# Patient Record
Sex: Female | Born: 1940 | Race: Black or African American | Hispanic: No | Marital: Single | Smoking: Never smoker
Health system: Southern US, Community
[De-identification: ages and names within clinical notes are randomized; demographics above are authoritative.]

## PROBLEM LIST (undated history)

## (undated) DIAGNOSIS — E872 Acidosis: Secondary | ICD-10-CM

## (undated) DIAGNOSIS — K851 Biliary acute pancreatitis without necrosis or infection: Secondary | ICD-10-CM

## (undated) DIAGNOSIS — K859 Acute pancreatitis without necrosis or infection, unspecified: Secondary | ICD-10-CM

## (undated) DIAGNOSIS — R4182 Altered mental status, unspecified: Secondary | ICD-10-CM

## (undated) DIAGNOSIS — I10 Essential (primary) hypertension: Secondary | ICD-10-CM

## (undated) DIAGNOSIS — N19 Unspecified kidney failure: Secondary | ICD-10-CM

## (undated) DIAGNOSIS — E162 Hypoglycemia, unspecified: Secondary | ICD-10-CM

## (undated) DIAGNOSIS — E785 Hyperlipidemia, unspecified: Secondary | ICD-10-CM

## (undated) HISTORY — DX: Altered mental status, unspecified: R41.82

## (undated) HISTORY — DX: Acute pancreatitis without necrosis or infection, unspecified: K85.90

## (undated) HISTORY — DX: Essential (primary) hypertension: I10

## (undated) HISTORY — DX: Hypoglycemia, unspecified: E16.2

## (undated) HISTORY — DX: Biliary acute pancreatitis without necrosis or infection: K85.10

## (undated) HISTORY — DX: Acidosis: E87.2

## (undated) HISTORY — DX: Hyperlipidemia, unspecified: E78.5

## (undated) HISTORY — DX: Unspecified kidney failure: N19

---

## 2016-02-04 ENCOUNTER — Encounter (HOSPITAL_COMMUNITY): Payer: Self-pay | Admitting: Emergency Medicine

## 2016-02-04 ENCOUNTER — Emergency Department (HOSPITAL_COMMUNITY): Payer: Medicare Other

## 2016-02-04 ENCOUNTER — Inpatient Hospital Stay (HOSPITAL_COMMUNITY)
Admission: EM | Admit: 2016-02-04 | Discharge: 2016-02-12 | DRG: 418 | Disposition: A | Discharge status: Home / Self Care | Payer: Medicare Other | Attending: Internal Medicine | Admitting: Internal Medicine

## 2016-02-04 DIAGNOSIS — N183 Chronic kidney disease, stage 3 (moderate): Secondary | ICD-10-CM | POA: Diagnosis not present

## 2016-02-04 DIAGNOSIS — N19 Unspecified kidney failure: Secondary | ICD-10-CM

## 2016-02-04 DIAGNOSIS — K8050 Calculus of bile duct without cholangitis or cholecystitis without obstruction: Secondary | ICD-10-CM

## 2016-02-04 DIAGNOSIS — Z720 Tobacco use: Secondary | ICD-10-CM

## 2016-02-04 DIAGNOSIS — I10 Essential (primary) hypertension: Secondary | ICD-10-CM | POA: Diagnosis not present

## 2016-02-04 DIAGNOSIS — E878 Other disorders of electrolyte and fluid balance, not elsewhere classified: Secondary | ICD-10-CM | POA: Diagnosis not present

## 2016-02-04 DIAGNOSIS — E872 Acidosis, unspecified: Secondary | ICD-10-CM

## 2016-02-04 DIAGNOSIS — N170 Acute kidney failure with tubular necrosis: Secondary | ICD-10-CM | POA: Diagnosis not present

## 2016-02-04 DIAGNOSIS — Z79899 Other long term (current) drug therapy: Secondary | ICD-10-CM | POA: Diagnosis not present

## 2016-02-04 DIAGNOSIS — R945 Abnormal results of liver function studies: Secondary | ICD-10-CM | POA: Diagnosis not present

## 2016-02-04 DIAGNOSIS — R000 Tachycardia, unspecified: Secondary | ICD-10-CM | POA: Diagnosis not present

## 2016-02-04 DIAGNOSIS — K859 Acute pancreatitis without necrosis or infection, unspecified: Secondary | ICD-10-CM | POA: Diagnosis not present

## 2016-02-04 DIAGNOSIS — K851 Biliary acute pancreatitis without necrosis or infection: Principal | ICD-10-CM | POA: Diagnosis present

## 2016-02-04 DIAGNOSIS — K8010 Calculus of gallbladder with chronic cholecystitis without obstruction: Secondary | ICD-10-CM | POA: Diagnosis not present

## 2016-02-04 DIAGNOSIS — F0390 Unspecified dementia without behavioral disturbance: Secondary | ICD-10-CM | POA: Diagnosis not present

## 2016-02-04 DIAGNOSIS — I129 Hypertensive chronic kidney disease with stage 1 through stage 4 chronic kidney disease, or unspecified chronic kidney disease: Secondary | ICD-10-CM | POA: Diagnosis not present

## 2016-02-04 DIAGNOSIS — N179 Acute kidney failure, unspecified: Secondary | ICD-10-CM | POA: Diagnosis present

## 2016-02-04 DIAGNOSIS — E876 Hypokalemia: Secondary | ICD-10-CM | POA: Diagnosis not present

## 2016-02-04 DIAGNOSIS — K8020 Calculus of gallbladder without cholecystitis without obstruction: Secondary | ICD-10-CM | POA: Diagnosis not present

## 2016-02-04 DIAGNOSIS — Z419 Encounter for procedure for purposes other than remedying health state, unspecified: Secondary | ICD-10-CM

## 2016-02-04 DIAGNOSIS — K8500 Idiopathic acute pancreatitis without necrosis or infection: Secondary | ICD-10-CM | POA: Diagnosis not present

## 2016-02-04 DIAGNOSIS — R739 Hyperglycemia, unspecified: Secondary | ICD-10-CM | POA: Diagnosis present

## 2016-02-04 DIAGNOSIS — K819 Cholecystitis, unspecified: Secondary | ICD-10-CM | POA: Diagnosis not present

## 2016-02-04 DIAGNOSIS — I360 Nonrheumatic tricuspid (valve) stenosis: Secondary | ICD-10-CM | POA: Diagnosis not present

## 2016-02-04 DIAGNOSIS — K8030 Calculus of bile duct with cholangitis, unspecified, without obstruction: Secondary | ICD-10-CM | POA: Diagnosis not present

## 2016-02-04 DIAGNOSIS — R109 Unspecified abdominal pain: Secondary | ICD-10-CM | POA: Diagnosis not present

## 2016-02-04 DIAGNOSIS — K831 Obstruction of bile duct: Secondary | ICD-10-CM | POA: Diagnosis not present

## 2016-02-04 DIAGNOSIS — K838 Other specified diseases of biliary tract: Secondary | ICD-10-CM | POA: Diagnosis not present

## 2016-02-04 HISTORY — DX: Acidosis, unspecified: E87.20

## 2016-02-04 HISTORY — DX: Acidosis: E87.2

## 2016-02-04 HISTORY — DX: Acute pancreatitis without necrosis or infection, unspecified: K85.90

## 2016-02-04 HISTORY — DX: Unspecified kidney failure: N19

## 2016-02-04 LAB — I-STAT CG4 LACTIC ACID, ED
LACTIC ACID, VENOUS: 3.37 mmol/L — AB (ref 0.5–1.9)
LACTIC ACID, VENOUS: 0.79 mmol/L (ref 0.5–1.9)

## 2016-02-04 LAB — LIPASE, BLOOD: Lipase: 5081 U/L — ABNORMAL HIGH (ref 11–51)

## 2016-02-04 LAB — CBC WITH DIFFERENTIAL/PLATELET
BASOS ABS: 0 10*3/uL (ref 0.0–0.1)
Basophils Relative: 0 %
Eosinophils Absolute: 0 10*3/uL (ref 0.0–0.7)
Eosinophils Relative: 0 %
HEMATOCRIT: 44.3 % (ref 36.0–46.0)
HEMOGLOBIN: 15.6 g/dL — AB (ref 12.0–15.0)
LYMPHS PCT: 6 %
Lymphs Abs: 0.8 10*3/uL (ref 0.7–4.0)
MCH: 35.1 pg — ABNORMAL HIGH (ref 26.0–34.0)
MCHC: 35.2 g/dL (ref 30.0–36.0)
MCV: 99.8 fL (ref 78.0–100.0)
MONO ABS: 1.9 10*3/uL — AB (ref 0.1–1.0)
MONOS PCT: 14 %
NEUTROS ABS: 10.8 10*3/uL — AB (ref 1.7–7.7)
Neutrophils Relative %: 80 %
Platelets: 255 10*3/uL (ref 150–400)
RBC: 4.44 MIL/uL (ref 3.87–5.11)
RDW: 14.1 % (ref 11.5–15.5)
WBC: 13.5 10*3/uL — ABNORMAL HIGH (ref 4.0–10.5)

## 2016-02-04 LAB — URINALYSIS, ROUTINE W REFLEX MICROSCOPIC
GLUCOSE, UA: NEGATIVE mg/dL
Ketones, ur: 40 mg/dL — AB
Leukocytes, UA: NEGATIVE
Nitrite: NEGATIVE
PH: 5.5 (ref 5.0–8.0)
Protein, ur: 100 mg/dL — AB
SPECIFIC GRAVITY, URINE: 1.02 (ref 1.005–1.030)

## 2016-02-04 LAB — COMPREHENSIVE METABOLIC PANEL
ALK PHOS: 53 U/L (ref 38–126)
ALT: 8 U/L — ABNORMAL LOW (ref 14–54)
AST: 23 U/L (ref 15–41)
Albumin: 3.8 g/dL (ref 3.5–5.0)
Anion gap: 15 (ref 5–15)
BILIRUBIN TOTAL: 1 mg/dL (ref 0.3–1.2)
BUN: 28 mg/dL — AB (ref 6–20)
CALCIUM: 9.1 mg/dL (ref 8.9–10.3)
CO2: 18 mmol/L — ABNORMAL LOW (ref 22–32)
Chloride: 102 mmol/L (ref 101–111)
Creatinine, Ser: 2.88 mg/dL — ABNORMAL HIGH (ref 0.44–1.00)
GFR calc Af Amer: 17 mL/min — ABNORMAL LOW (ref >60)
GFR, EST NON AFRICAN AMERICAN: 15 mL/min — AB (ref >60)
Glucose, Bld: 177 mg/dL — ABNORMAL HIGH (ref 65–99)
POTASSIUM: 4.3 mmol/L (ref 3.5–5.1)
Sodium: 135 mmol/L (ref 135–145)
TOTAL PROTEIN: 7.8 g/dL (ref 6.5–8.1)

## 2016-02-04 LAB — I-STAT TROPONIN, ED: TROPONIN I, POC: 0.04 ng/mL (ref 0.00–0.08)

## 2016-02-04 LAB — URINE MICROSCOPIC-ADD ON

## 2016-02-04 LAB — D-DIMER, QUANTITATIVE: D-Dimer, Quant: 14.54 ug/mL-FEU — ABNORMAL HIGH (ref 0.00–0.50)

## 2016-02-04 MED ORDER — SODIUM CHLORIDE 0.9 % IV BOLUS (SEPSIS)
1000.0000 mL | Freq: Once | INTRAVENOUS | Status: AC
  Administered 2016-02-04: 1000 mL via INTRAVENOUS

## 2016-02-04 MED ORDER — MORPHINE SULFATE (PF) 4 MG/ML IV SOLN: 4.0000 mg | Freq: Once | INTRAVENOUS | Status: DC

## 2016-02-04 MED ORDER — IOPAMIDOL (ISOVUE-300) INJECTION 61%: 30.0000 mL | Freq: Once | INTRAVENOUS | Status: DC | PRN

## 2016-02-04 MED ORDER — DEXTROSE-NACL 5-0.9 % IV SOLN
INTRAVENOUS | Status: DC
  Administered 2016-02-05: 125 mL/h via INTRAVENOUS
  Administered 2016-02-06: 05:00:00 via INTRAVENOUS

## 2016-02-04 MED ORDER — ONDANSETRON HCL 4 MG/2ML IJ SOLN
4.0000 mg | Freq: Four times a day (QID) | INTRAMUSCULAR | Status: DC | PRN
  Administered 2016-02-07: 4 mg via INTRAVENOUS
  Filled 2016-02-04: qty 2

## 2016-02-04 MED ORDER — ACETAMINOPHEN 650 MG RE SUPP: 650.0000 mg | Freq: Four times a day (QID) | RECTAL | Status: DC | PRN

## 2016-02-04 MED ORDER — ONDANSETRON HCL 4 MG PO TABS: 4.0000 mg | ORAL_TABLET | Freq: Four times a day (QID) | ORAL | Status: DC | PRN

## 2016-02-04 MED ORDER — METOPROLOL TARTRATE 25 MG PO TABS
25.0000 mg | ORAL_TABLET | Freq: Two times a day (BID) | ORAL | Status: DC
  Administered 2016-02-04: 25 mg via ORAL
  Filled 2016-02-04: qty 1

## 2016-02-04 MED ORDER — SODIUM CHLORIDE 0.9 % IV SOLN
INTRAVENOUS | Status: AC
  Administered 2016-02-04: 21:00:00 via INTRAVENOUS

## 2016-02-04 MED ORDER — AMLODIPINE BESYLATE 10 MG PO TABS
10.0000 mg | ORAL_TABLET | Freq: Every day | ORAL | Status: DC
  Administered 2016-02-04 – 2016-02-05 (×2): 10 mg via ORAL
  Filled 2016-02-04: qty 2
  Filled 2016-02-04: qty 1

## 2016-02-04 MED ORDER — ENOXAPARIN SODIUM 30 MG/0.3ML ~~LOC~~ SOLN
30.0000 mg | Freq: Every day | SUBCUTANEOUS | Status: DC
  Administered 2016-02-04 – 2016-02-10 (×7): 30 mg via SUBCUTANEOUS
  Filled 2016-02-04 (×9): qty 0.3

## 2016-02-04 MED ORDER — LABETALOL HCL 5 MG/ML IV SOLN
10.0000 mg | Freq: Once | INTRAVENOUS | Status: AC
  Administered 2016-02-04: 10 mg via INTRAVENOUS
  Filled 2016-02-04: qty 4

## 2016-02-04 MED ORDER — ONDANSETRON HCL 4 MG/2ML IJ SOLN
4.0000 mg | Freq: Once | INTRAMUSCULAR | Status: AC
  Administered 2016-02-04: 4 mg via INTRAVENOUS
  Filled 2016-02-04: qty 2

## 2016-02-04 MED ORDER — DEXTROSE-NACL 5-0.9 % IV SOLN: INTRAVENOUS | Status: DC

## 2016-02-04 MED ORDER — PIPERACILLIN-TAZOBACTAM 3.375 G IVPB 30 MIN
3.3750 g | Freq: Once | INTRAVENOUS | Status: AC
  Administered 2016-02-04: 3.375 g via INTRAVENOUS
  Filled 2016-02-04: qty 50

## 2016-02-04 MED ORDER — ACETAMINOPHEN 325 MG PO TABS
650.0000 mg | ORAL_TABLET | Freq: Four times a day (QID) | ORAL | Status: DC | PRN
  Administered 2016-02-07: 650 mg via ORAL
  Filled 2016-02-04: qty 2

## 2016-02-04 NOTE — ED Notes (Signed)
EDP given lactic result.

## 2016-02-04 NOTE — ED Notes (Addendum)
Manual BP checked-185/118. Tegeler, MD notified.

## 2016-02-04 NOTE — ED Notes (Signed)
Rectal Temp-97.96F

## 2016-02-04 NOTE — ED Notes (Signed)
Manual BP Check- 215/100

## 2016-02-04 NOTE — Consult Note (Signed)
Reason for Consult:Gallstone pancreatitis Referring Physician: Dr Doran Durand is an 75 y.o. female.  HPI: 75 year old female who presents to the hospital with approximately 3-4 weeks of worsening upper abdominal pain. This was associated with new onset nausea and vomiting yesterday.  She denies any history of alcohol abuse.  History reviewed. No pertinent past medical history.  History reviewed. No pertinent surgical history.  History reviewed. No pertinent family history.  Social History:  reports that she has never smoked. She has never used smokeless tobacco. She reports that she does not drink alcohol or use drugs.  Allergies: No Known Allergies  Medications: I have reviewed the patient's current medications.  Results for orders placed or performed during the hospital encounter of 02/04/16 (from the past 48 hour(s))  Urinalysis, Routine w reflex microscopic (not at [facility])     Status: Abnormal   Collection Time: 02/04/16 11:03 AM  Result Value Ref Range   Color, Urine AMBER (A) YELLOW    Comment: BIOCHEMICALS MAY BE AFFECTED BY COLOR   APPearance TURBID (A) CLEAR   Specific Gravity, Urine 1.020 1.005 - 1.030   pH 5.5 5.0 - 8.0   Glucose, UA NEGATIVE NEGATIVE mg/dL   Hgb urine dipstick MODERATE (A) NEGATIVE   Bilirubin Urine LARGE (A) NEGATIVE   Ketones, ur 40 (A) NEGATIVE mg/dL   Protein, ur 100 (A) NEGATIVE mg/dL   Nitrite NEGATIVE NEGATIVE   Leukocytes, UA NEGATIVE NEGATIVE  Urine microscopic-add on     Status: Abnormal   Collection Time: 02/04/16 11:03 AM  Result Value Ref Range   Squamous Epithelial / LPF 0-5 (A) NONE SEEN   WBC, UA 0-5 0 - 5 WBC/hpf   RBC / HPF 6-30 0 - 5 RBC/hpf   Bacteria, UA MANY (A) NONE SEEN   Urine-Other MUCOUS PRESENT   CBC with Differential     Status: Abnormal   Collection Time: 02/04/16 12:05 PM  Result Value Ref Range   WBC 13.5 (H) 4.0 - 10.5 K/uL   RBC 4.44 3.87 - 5.11 MIL/uL   Hemoglobin 15.6 (H) 12.0 - 15.0 g/dL   HCT  44.3 36.0 - 46.0 %   MCV 99.8 78.0 - 100.0 fL   MCH 35.1 (H) 26.0 - 34.0 pg   MCHC 35.2 30.0 - 36.0 g/dL   RDW 14.1 11.5 - 15.5 %   Platelets 255 150 - 400 K/uL   Neutrophils Relative % 80 %   Neutro Abs 10.8 (H) 1.7 - 7.7 K/uL   Lymphocytes Relative 6 %   Lymphs Abs 0.8 0.7 - 4.0 K/uL   Monocytes Relative 14 %   Monocytes Absolute 1.9 (H) 0.1 - 1.0 K/uL   Eosinophils Relative 0 %   Eosinophils Absolute 0.0 0.0 - 0.7 K/uL   Basophils Relative 0 %   Basophils Absolute 0.0 0.0 - 0.1 K/uL  Comprehensive metabolic panel     Status: Abnormal   Collection Time: 02/04/16 12:05 PM  Result Value Ref Range   Sodium 135 135 - 145 mmol/L   Potassium 4.3 3.5 - 5.1 mmol/L   Chloride 102 101 - 111 mmol/L   CO2 18 (L) 22 - 32 mmol/L   Glucose, Bld 177 (H) 65 - 99 mg/dL   BUN 28 (H) 6 - 20 mg/dL   Creatinine, Ser 2.88 (H) 0.44 - 1.00 mg/dL   Calcium 9.1 8.9 - 10.3 mg/dL   Total Protein 7.8 6.5 - 8.1 g/dL   Albumin 3.8 3.5 - 5.0 g/dL  AST 23 15 - 41 U/L   ALT 8 (L) 14 - 54 U/L   Alkaline Phosphatase 53 38 - 126 U/L   Total Bilirubin 1.0 0.3 - 1.2 mg/dL   GFR calc non Af Amer 15 (L) >60 mL/min   GFR calc Af Amer 17 (L) >60 mL/min    Comment: (NOTE) The eGFR has been calculated using the CKD EPI equation. This calculation has not been validated in all clinical situations. eGFR's persistently <60 mL/min signify possible Chronic Kidney Disease.    Anion gap 15 5 - 15  Lipase, blood     Status: Abnormal   Collection Time: 02/04/16 12:05 PM  Result Value Ref Range   Lipase 5,081 (H) 11 - 51 U/L    Comment: RESULTS CONFIRMED BY MANUAL DILUTION  I-Stat CG4 Lactic Acid, ED     Status: Abnormal   Collection Time: 02/04/16 12:15 PM  Result Value Ref Range   Lactic Acid, Venous 3.37 (HH) 0.5 - 1.9 mmol/L   Comment NOTIFIED PHYSICIAN   I-Stat Troponin, ED (not at [facility])     Status: None   Collection Time: 02/04/16 12:19 PM  Result Value Ref Range   Troponin i, poc 0.04 0.00 - 0.08 ng/mL    Comment 3            Comment: Due to the release kinetics of cTnI, a negative result within the first hours of the onset of symptoms does not rule out myocardial infarction with certainty. If myocardial infarction is still suspected, repeat the test at appropriate intervals.   D-dimer, quantitative (not at [facility])     Status: Abnormal   Collection Time: 02/04/16 12:50 PM  Result Value Ref Range   D-Dimer, Quant 14.54 (H) 0.00 - 0.50 ug/mL-FEU    Comment: (NOTE) At the manufacturer cut-off of 0.50 ug/mL FEU, this assay has been documented to exclude PE with a sensitivity and negative predictive value of 97 to 99%.  At this time, this assay has not been approved by the FDA to exclude DVT/VTE. Results should be correlated with clinical presentation.   I-Stat CG4 Lactic Acid, ED     Status: None   Collection Time: 02/04/16  3:14 PM  Result Value Ref Range   Lactic Acid, Venous 0.79 0.5 - 1.9 mmol/L    Ct Abdomen Pelvis Wo Contrast  Result Date: 02/04/2016 CLINICAL DATA:  75 year old female with recent history of abdominal pain, with some vomiting and diarrhea yesterday. Lethargy and disorientation. EXAM: CT ABDOMEN AND PELVIS WITHOUT CONTRAST TECHNIQUE: Multidetector CT imaging of the abdomen and pelvis was performed following the standard protocol without IV contrast. COMPARISON:  No priors. FINDINGS: Lower chest: Atherosclerotic calcifications in the left circumflex and right coronary arteries. Calcifications of the mitral annulus and the aortic root. Mild scarring throughout the visualize lung bases. Hepatobiliary: No definite cystic or solid hepatic lesions identified on today's noncontrast CT examination. Tiny calcified granuloma in the right lobe of the liver. Numerous calcified gallstones lying dependently in the gallbladder. No findings to suggest an acute cholecystitis at this time. Pancreas: No discrete pancreatic mass confidently identified on today's noncontrast CT examination.  However, there are extensive inflammatory changes noted throughout the retroperitoneum adjacent to the spleen, concerning for an acute pancreatitis. No well-defined peripancreatic fluid collection is noted to strongly suggest a pancreatic pseudocyst at this time. Spleen: Unremarkable. Adrenals/Urinary Tract: Unenhanced appearance of the kidneys and bilateral adrenal glands is unremarkable. No hydroureteronephrosis. Urinary bladder is normal in appearance. Stomach/Bowel: Unenhanced appearance  of the stomach is normal. There is no pathologic dilatation of small bowel or colon. Normal appendix. Rectum is moderately distended with well-formed stool, exerting mass effect upon adjacent structures. Vascular/Lymphatic: Aortic atherosclerosis, without definite aneurysm in the abdominal or pelvic vasculature. No definite lymphadenopathy is noted in the abdomen or pelvis. Reproductive: Uterus and ovaries are atrophic. Importantly, the vagina appears partially distended by what appears to be feculent contents, raising concern for potential rectovaginal fistula. Other: No significant volume of ascites.  No pneumoperitoneum. Musculoskeletal: There are no aggressive appearing lytic or blastic lesions noted in the visualized portions of the skeleton. IMPRESSION: 1. Imaging findings are concerning for an acute pancreatitis. 2. Cholelithiasis without evidence to suggest an acute cholecystitis at this time. 3. The vagina appears partially distended with what appears to be feculent contents. This raises concern for potential rectovaginal fistula. Clinical correlation is suggested. 4. Aortic atherosclerosis, in addition to at least 2 vessel coronary artery disease. Assessment for potential risk factor modification, dietary therapy or pharmacologic therapy may be warranted, if clinically indicated. 5. Additional incidental findings, as above. Electronically Signed   By: Vinnie Langton M.D.   On: 02/04/2016 15:00   Dg Chest 2  View  Result Date: 02/04/2016 CLINICAL DATA:  tachycardia, disoriented EXAM: CHEST  2 VIEW COMPARISON:  None. FINDINGS: Cardiomediastinal silhouette is unremarkable. No acute infiltrate or pleural effusion. No pulmonary edema. Mild degenerative changes thoracic spine. IMPRESSION: No active cardiopulmonary disease. Electronically Signed   By: Lahoma Crocker M.D.   On: 02/04/2016 12:32    Review of Systems  Constitutional: Negative for chills and fever.  Eyes: Negative for blurred vision.  Respiratory: Negative for cough and shortness of breath.   Cardiovascular: Negative for chest pain.  Gastrointestinal: Positive for abdominal pain, nausea and vomiting.  Genitourinary: Negative for dysuria, frequency and urgency.  Skin: Negative for rash.  Neurological: Negative for dizziness and headaches.   Blood pressure (!) 212/97, pulse 95, temperature 99.8 F (37.7 C), temperature source Oral, resp. rate 18, height 5' (1.524 m), weight 44.4 kg (97 lb 14.2 oz), SpO2 100 %. Physical Exam  Constitutional: She is oriented to person, place, and time. She appears well-developed and well-nourished.  HENT:  Head: Normocephalic and atraumatic.  Eyes: Conjunctivae and EOM are normal. Pupils are equal, round, and reactive to light.  Neck: Normal range of motion. Neck supple.  Cardiovascular: Normal rate.   Respiratory: Effort normal and breath sounds normal. No respiratory distress.  GI: Soft. She exhibits no distension. There is tenderness (midepigastric).  Musculoskeletal: Normal range of motion.  Neurological: She is alert and oriented to person, place, and time.  Skin: Skin is warm and dry.    Assessment/Plan: 75 year old female being admitted to the hospital for  What appears to be gallstone pancreatitis.  Will follow with you. Patient may be a candidate for surgery while in the hospital to remove her gallbladder.  Keiden Deskin C. [id number], 9:13 PM

## 2016-02-04 NOTE — ED Provider Notes (Signed)
[facility] DEPT Provider Note   CSN: [id number] Arrival date & time: 02/04/16  1004     History   Chief Complaint Chief Complaint  Patient presents with  . decreased appetite    HPI Kristine Shaffer is a 75 y.o. female with no past medical history who has not seen a doctor in "a long time" who presents with altered mental status, nausea, vomiting, abdominal pain, and diarrhea. Patient is committed by her niece whom she lives with. She moved in with her niece 4 months ago and these reports the patient "never eats". She denies any change in by mouth intake however she does not think the patient has eaten anything in several days. She says that the patient has planed of abdominal pain over the last few days that worsened yesterday. She also had episode of nausea and vomiting. Patient had diarrhea yesterday and today. Patient describes the pain as a 8 out of 10 in severity, located in her epigastrium radiating all over her abdomen. She denies any fevers or chills, chest pain, shortness of breath, palpitations, lightheadedness, or any urinary symptoms. Patient is disoriented and only is oriented to her name. And he says that the patient is a normal baseline mental status and has no history of dementia. Niece denies any falls recently. Patient denies any recent trauma.    The history is provided by the patient and a relative. No language interpreter was used.  Abdominal Pain   This is a new problem. The current episode started yesterday. The problem occurs constantly. The problem has been gradually worsening. The pain is located in the generalized abdominal region and epigastric region. The quality of the pain is aching, sharp and pressure-like. The pain is at a severity of 8/10. The pain is severe. Associated symptoms include anorexia, diarrhea, nausea and vomiting. Pertinent negatives include fever, constipation, dysuria, frequency, hematuria and headaches. The symptoms are aggravated by  palpation. Nothing relieves the symptoms.    History reviewed. No pertinent past medical history.  There are no active problems to display for this patient.   History reviewed. No pertinent surgical history.  OB History    No data available       Home Medications    Prior to Admission medications   Not on File    Family History History reviewed. No pertinent family history.  Social History Social History  Substance Use Topics  . Smoking status: Never Smoker  . Smokeless tobacco: Never Used  . Alcohol use No     Allergies   Review of patient's allergies indicates no known allergies.   Review of Systems Review of Systems  Constitutional: Positive for chills. Negative for activity change, diaphoresis, fatigue and fever.  HENT: Negative for congestion and rhinorrhea.   Eyes: Negative for visual disturbance.  Respiratory: Negative for cough, chest tightness, shortness of breath and stridor.   Cardiovascular: Negative for chest pain, palpitations and leg swelling.  Gastrointestinal: Positive for abdominal pain, anorexia, diarrhea, nausea and vomiting. Negative for abdominal distention and constipation.  Genitourinary: Positive for decreased urine volume. Negative for difficulty urinating, dysuria, flank pain, frequency, hematuria, menstrual problem, pelvic pain, vaginal bleeding and vaginal discharge.  Musculoskeletal: Negative for back pain and neck pain.  Skin: Negative for rash and wound.  Neurological: Negative for dizziness, weakness, light-headedness, numbness and headaches.  Psychiatric/Behavioral: Negative for agitation and confusion.  All other systems reviewed and are negative.    Physical Exam Updated Vital Signs BP (!) 155/107   Pulse 119  Resp 16   SpO2 100%   Physical Exam  Constitutional: She is oriented to person, place, and time. She appears well-developed and well-nourished. No distress.  HENT:  Head: Normocephalic and atraumatic.    Mouth/Throat: Oropharynx is clear and moist.  Eyes: Conjunctivae and EOM are normal. Pupils are equal, round, and reactive to light.  Neck: Normal range of motion. Neck supple.  Cardiovascular: Regular rhythm.   No murmur heard. Pulmonary/Chest: Effort normal and breath sounds normal. No respiratory distress. She has no wheezes. She exhibits no tenderness.  Abdominal: Soft. There is tenderness in the right upper quadrant, epigastric area and left upper quadrant. There is no CVA tenderness.    Musculoskeletal: She exhibits no edema.  Neurological: She is alert and oriented to person, place, and time. She has normal reflexes. She displays normal reflexes. No cranial nerve deficit. She exhibits normal muscle tone.  Skin: Skin is warm and dry. Capillary refill takes less than 2 seconds. No rash noted. No erythema.  Psychiatric: She has a normal mood and affect.  Nursing note and vitals reviewed.    ED Treatments / Results  Labs (all labs ordered are listed, but only abnormal results are displayed) Labs Reviewed  CBC WITH DIFFERENTIAL/PLATELET - Abnormal; Notable for the following:       Result Value   WBC 13.5 (*)    Hemoglobin 15.6 (*)    MCH 35.1 (*)    Neutro Abs 10.8 (*)    Monocytes Absolute 1.9 (*)    All other components within normal limits  COMPREHENSIVE METABOLIC PANEL - Abnormal; Notable for the following:    CO2 18 (*)    Glucose, Bld 177 (*)    BUN 28 (*)    Creatinine, Ser 2.88 (*)    ALT 8 (*)    GFR calc non Af Amer 15 (*)    GFR calc Af Amer 17 (*)    All other components within normal limits  LIPASE, BLOOD - Abnormal; Notable for the following:    Lipase 5,081 (*)    All other components within normal limits  URINALYSIS, ROUTINE W REFLEX MICROSCOPIC (NOT AT [facility]) - Abnormal; Notable for the following:    Color, Urine AMBER (*)    APPearance TURBID (*)    Hgb urine dipstick MODERATE (*)    Bilirubin Urine LARGE (*)    Ketones, ur 40 (*)    Protein, ur 100  (*)    All other components within normal limits  D-DIMER, QUANTITATIVE (NOT AT [facility]) - Abnormal; Notable for the following:    D-Dimer, Quant 14.54 (*)    All other components within normal limits  URINE MICROSCOPIC-ADD ON - Abnormal; Notable for the following:    Squamous Epithelial / LPF 0-5 (*)    Bacteria, UA MANY (*)    All other components within normal limits  I-STAT CG4 LACTIC ACID, ED - Abnormal; Notable for the following:    Lactic Acid, Venous 3.37 (*)    All other components within normal limits  URINE CULTURE  CULTURE, BLOOD (ROUTINE X 2)  CULTURE, BLOOD (ROUTINE X 2)  BASIC METABOLIC PANEL  RPR  RAPID HIV SCREEN (HIV 1/2 AB+AG)  HIV ANTIBODY (ROUTINE TESTING)  TRIGLYCERIDES  VITAMIN B12  LIPASE, BLOOD  I-STAT TROPOININ, ED  I-STAT CG4 LACTIC ACID, ED    EKG  EKG Interpretation  Date/Time:  Thursday February 04 2016 10:29:36 EDT Ventricular Rate:  121 PR Interval:    QRS Duration: 74 QT Interval:  291 QTC Calculation: 413 R Axis:   69 Text Interpretation:  Sinus tachycardia Left atrial enlargement Probable LVH with secondary repol abnrm ST depression, consider ischemia, diffuse lds No Prior ECG available.  Abnormal ECG Confirmed by Sherry Ruffing MD, Jaslen Adcox [phone]) on 02/04/2016 11:24:43 AM       Radiology Ct Abdomen Pelvis Wo Contrast  Result Date: 02/04/2016 CLINICAL DATA:  75 year old female with recent history of abdominal pain, with some vomiting and diarrhea yesterday. Lethargy and disorientation. EXAM: CT ABDOMEN AND PELVIS WITHOUT CONTRAST TECHNIQUE: Multidetector CT imaging of the abdomen and pelvis was performed following the standard protocol without IV contrast. COMPARISON:  No priors. FINDINGS: Lower chest: Atherosclerotic calcifications in the left circumflex and right coronary arteries. Calcifications of the mitral annulus and the aortic root. Mild scarring throughout the visualize lung bases. Hepatobiliary: No definite cystic or solid hepatic  lesions identified on today's noncontrast CT examination. Tiny calcified granuloma in the right lobe of the liver. Numerous calcified gallstones lying dependently in the gallbladder. No findings to suggest an acute cholecystitis at this time. Pancreas: No discrete pancreatic mass confidently identified on today's noncontrast CT examination. However, there are extensive inflammatory changes noted throughout the retroperitoneum adjacent to the spleen, concerning for an acute pancreatitis. No well-defined peripancreatic fluid collection is noted to strongly suggest a pancreatic pseudocyst at this time. Spleen: Unremarkable. Adrenals/Urinary Tract: Unenhanced appearance of the kidneys and bilateral adrenal glands is unremarkable. No hydroureteronephrosis. Urinary bladder is normal in appearance. Stomach/Bowel: Unenhanced appearance of the stomach is normal. There is no pathologic dilatation of small bowel or colon. Normal appendix. Rectum is moderately distended with well-formed stool, exerting mass effect upon adjacent structures. Vascular/Lymphatic: Aortic atherosclerosis, without definite aneurysm in the abdominal or pelvic vasculature. No definite lymphadenopathy is noted in the abdomen or pelvis. Reproductive: Uterus and ovaries are atrophic. Importantly, the vagina appears partially distended by what appears to be feculent contents, raising concern for potential rectovaginal fistula. Other: No significant volume of ascites.  No pneumoperitoneum. Musculoskeletal: There are no aggressive appearing lytic or blastic lesions noted in the visualized portions of the skeleton. IMPRESSION: 1. Imaging findings are concerning for an acute pancreatitis. 2. Cholelithiasis without evidence to suggest an acute cholecystitis at this time. 3. The vagina appears partially distended with what appears to be feculent contents. This raises concern for potential rectovaginal fistula. Clinical correlation is suggested. 4. Aortic  atherosclerosis, in addition to at least 2 vessel coronary artery disease. Assessment for potential risk factor modification, dietary therapy or pharmacologic therapy may be warranted, if clinically indicated. 5. Additional incidental findings, as above. Electronically Signed   By: Vinnie Langton M.D.   On: 02/04/2016 15:00   Dg Chest 2 View  Result Date: 02/04/2016 CLINICAL DATA:  tachycardia, disoriented EXAM: CHEST  2 VIEW COMPARISON:  None. FINDINGS: Cardiomediastinal silhouette is unremarkable. No acute infiltrate or pleural effusion. No pulmonary edema. Mild degenerative changes thoracic spine. IMPRESSION: No active cardiopulmonary disease. Electronically Signed   By: Lahoma Crocker M.D.   On: 02/04/2016 12:32    Procedures Procedures (including critical care time)  Medications Ordered in ED Medications  morphine 4 MG/ML injection 4 mg (0 mg Intravenous Hold 02/04/16 1144)  iopamidol (ISOVUE-300) 61 % injection 30 mL (not administered)  0.9 %  sodium chloride infusion ( Intravenous New Bag/Given 02/04/16 2035)  acetaminophen (TYLENOL) tablet 650 mg (not administered)    Or  acetaminophen (TYLENOL) suppository 650 mg (not administered)  ondansetron (ZOFRAN) tablet 4 mg (not administered)  Or  ondansetron (ZOFRAN) injection 4 mg (not administered)  metoprolol tartrate (LOPRESSOR) tablet 25 mg (25 mg Oral Given 02/04/16 1836)  amLODipine (NORVASC) tablet 10 mg (10 mg Oral Given 02/04/16 1836)  dextrose 5 %-0.9 % sodium chloride infusion (not administered)  enoxaparin (LOVENOX) injection 30 mg (not administered)  sodium chloride 0.9 % bolus 1,000 mL (0 mLs Intravenous Stopped 02/04/16 1219)  ondansetron (ZOFRAN) injection 4 mg (4 mg Intravenous Given 02/04/16 1126)  sodium chloride 0.9 % bolus 1,000 mL (0 mLs Intravenous Stopped 02/04/16 1600)  labetalol (NORMODYNE,TRANDATE) injection 10 mg (10 mg Intravenous Given 02/04/16 1319)  piperacillin-tazobactam (ZOSYN) IVPB 3.375 g (0 g  Intravenous Stopped 02/04/16 1450)     Initial Impression / Assessment and Plan / ED Course  I have reviewed the triage vital signs and the nursing notes.  Pertinent labs & imaging results that were available during my care of the patient were reviewed by me and considered in my medical decision making (see chart for details).  Clinical Course    Cyra Pincock is a 75 y.o. female with no past medical history who has not seen a doctor in "a long time" who presents with altered mental status, nausea, vomiting, abdominal pain, and diarrhea.  History and exam are seen above. Next  On exam, patient had tenderness all over her abdomen. Patient's lungs were clear, CVA area was nontender, and patient had normal strength, coordination, and sensation. Patient was tachycardic on arrival in the 1 teens. Initial temperature was not readable in triage so rectal temperature was ordered.  Given concern for dehydration, fluids were ordered, patient given pain medicine, nausea medicine, and workup will be started to look for etiology of her tachycardia as well as imaging to look for occult infection or intra-abdominal pathology.  Patient was tachycardic and hypertensive. Suspect chronic hypertension however, patient given fluids for clinical dehydration. Patient given beta blocker which improve heart rate. Did not continue this as concern for infection worsened.  Laboratory testing Revealed evidence of pancreatitis. Lipase over 5000. Patient also had leukocytosis and elevated lactic acid. With these findings, patient given IV Zosyn for antibiotic coverage. D dimer elevated at 14 however, patient had elevated creatinine and could not tolerate the study. CT of the abdomen change to noncontrast scan.    CT of the abdomen and pelvis return showing evidence of Acute pancreatitis. There was also evidence of possible recto vaginally fistula due to vaginally contents. This will need to be followed up by admitting team.  This was discussed with admitting team.  After workup completed in ED, patient admitted to hospitalist service for further management of kidney injury, pancreatitis, and dehydration. Patient admitted in stable condition.   Final Clinical Impressions(s) / ED Diagnoses   Final diagnoses:  Acute pancreatitis, unspecified complication status, unspecified pancreatitis type   Clinical Impression: 1. Acute pancreatitis, unspecified complication status, unspecified pancreatitis type     Disposition: Admit to Hospitalist service    Courtney Paris, MD 02/04/16 2051

## 2016-02-04 NOTE — Progress Notes (Signed)
Brief Pharmacy Note: Lovenox for VTE prophylaxis  Patient stated weight: 100lb - 45kg SCr 2.88 on admission: Cl ~ 19 ml/min (normalized)  Lovenox 30mg  SQ q24hr  Pharmacy will sign off protocol, will adjust dose if warranted  Thank you,  Minda Ditto PharmD Pager [phone] 02/04/2016, 5:41 PM

## 2016-02-04 NOTE — ED Notes (Signed)
Off floor for testing 

## 2016-02-04 NOTE — ED Notes (Signed)
Pt aware UA needed, attempted bed pan. Unable to collect at this time. Will recheck.

## 2016-02-04 NOTE — ED Notes (Signed)
Below order not completed by EW. 

## 2016-02-04 NOTE — ED Notes (Signed)
I attempted to collect labs and was unsuccessful.  I made the MD aware

## 2016-02-04 NOTE — H&P (Addendum)
History and Physical    Kristine Shaffer [id number] DOB: July 18, 1940 DOA: 02/04/2016    PCP: No primary care provider on file.  Patient coming from: home  Chief Complaint: brought in for abdominal pain and vomiting  HPI: Kristine Shaffer is a 75 y.o. female with who has not seen a doctor in a long time and has no past medical history. The patient is a poor historian (dementia?) but nieces state she has been having abdominal pain for weeks now. She had some severe pain about 1 month ago but it resolved without treatment. Yesterday they noted that the patient had 2 episodes of vomiting. No blood in vomitus. They note that after certain types of food like salad, she has to go the bathroom. The patient denies all symptoms other than abdominal pain. She points to her mid abdomen to show where the pain is. Pain does not radiate and has not been severe.  When asked specifically about possible stool coming out of Vagina, she states that this has not been an issue.   ED Course: Lipase-5081, BUN 28, creatinine 2.88, CO2 18, WBC count 13, hemoglobin 15.6, lactic acid 3.37>> 0.79, d-dimer 14.54, UA shows many bacteria, large amount of bilirubin, moderate hemoglobin, 40 ketones, 0-5 wbc's, 100 protein CT abdomen and pelvis-acute pancreatitis, cholelithiasis, vagina appears distended with possible fecal contents  Review of Systems:  - slow weight loss over the years- about 5 lbs in past month All other systems reviewed and apart from HPI, are negative.  History reviewed. No pertinent past medical history.  History reviewed. No pertinent surgical history.  Social History:   reports that she has never smoked. She has never used smokeless tobacco. She reports that she does not drink alcohol or use drugs.  Dips snuff, drinks a small amount of "hard liquor" per day  No Known Allergies  Family history- HTN   Prior to Admission medications   Medication Sig Start Date End Date Taking? Authorizing Provider    cholecalciferol (VITAMIN D) 1000 units tablet Take 1,000 Units by mouth daily.   Yes Historical Provider, MD  ferrous sulfate 325 (65 FE) MG tablet Take 325 mg by mouth daily with breakfast.   Yes Historical Provider, MD  Multiple Vitamin (MULTIVITAMIN WITH MINERALS) TABS tablet Take 1 tablet by mouth daily.   Yes Historical Provider, MD    Physical Exam: Vitals:   02/04/16 1200 02/04/16 1300 02/04/16 1500 02/04/16 1600  BP: (!) 215/100 (!) 221/105 (!) 206/99 (!) 209/117  Pulse:  110 82 97  Resp: 23 16 21 26   SpO2:  99% 100% 99%      Constitutional: NAD, calm, comfortable Eyes: PERTLA, lids and conjunctivae normal ENMT: Mucous membranes are moist. Posterior pharynx clear of any exudate or lesions. Normal dentition.  Neck: normal, supple, no masses, no thyromegaly Respiratory: clear to auscultation bilaterally, no wheezing, no crackles. Normal respiratory effort. No accessory muscle use.  Cardiovascular: S1 & S2 heard, regular rate and rhythm, no murmurs / rubs / gallops. No extremity edema. 2+ pedal pulses. No carotid bruits.  Abdomen: No distension,  tenderness diffusely in abdomen with maximal tenderness in mid abdomen, no masses palpated. No hepatosplenomegaly. Bowel sounds normal.  Musculoskeletal: no clubbing / cyanosis. No joint deformity upper and lower extremities. Good ROM, no contractures. Normal muscle tone.  Skin: no rashes, lesions, ulcers. No induration Neurologic: CN 2-12 grossly intact. Sensation intact, DTR normal. Strength 5/5 in all 4 limbs.  Psychiatric: Normal judgment and insight. Alert and oriented x 3.  Normal mood.     Labs on Admission: I have personally reviewed following labs and imaging studies  CBC:  Recent Labs Lab 02/04/16 1205  WBC 13.5*  NEUTROABS 10.8*  HGB 15.6*  HCT 44.3  MCV 99.8  PLT 123456   Basic Metabolic Panel:  Recent Labs Lab 02/04/16 1205  NA 135  K 4.3  CL 102  CO2 18*  GLUCOSE 177*  BUN 28*  CREATININE 2.88*   CALCIUM 9.1   GFR: CrCl cannot be calculated (Unknown ideal weight.). Liver Function Tests:  Recent Labs Lab 02/04/16 1205  AST 23  ALT 8*  ALKPHOS 53  BILITOT 1.0  PROT 7.8  ALBUMIN 3.8    Recent Labs Lab 02/04/16 1205  LIPASE 5,081*   No results for input(s): AMMONIA in the last 168 hours. Coagulation Profile: No results for input(s): INR, PROTIME in the last 168 hours. Cardiac Enzymes: No results for input(s): CKTOTAL, CKMB, CKMBINDEX, TROPONINI in the last 168 hours. BNP (last 3 results) No results for input(s): PROBNP in the last 8760 hours. HbA1C: No results for input(s): HGBA1C in the last 72 hours. CBG: No results for input(s): GLUCAP in the last 168 hours. Lipid Profile: No results for input(s): CHOL, HDL, LDLCALC, TRIG, CHOLHDL, LDLDIRECT in the last 72 hours. Thyroid Function Tests: No results for input(s): TSH, T4TOTAL, FREET4, T3FREE, THYROIDAB in the last 72 hours. Anemia Panel: No results for input(s): VITAMINB12, FOLATE, FERRITIN, TIBC, IRON, RETICCTPCT in the last 72 hours. Urine analysis:    Component Value Date/Time   COLORURINE AMBER (A) 02/04/2016 1103   APPEARANCEUR TURBID (A) 02/04/2016 1103   LABSPEC 1.020 02/04/2016 1103   PHURINE 5.5 02/04/2016 1103   GLUCOSEU NEGATIVE 02/04/2016 1103   HGBUR MODERATE (A) 02/04/2016 1103   BILIRUBINUR LARGE (A) 02/04/2016 1103   KETONESUR 40 (A) 02/04/2016 1103   PROTEINUR 100 (A) 02/04/2016 1103   NITRITE NEGATIVE 02/04/2016 1103   LEUKOCYTESUR NEGATIVE 02/04/2016 1103   Sepsis Labs: @LABRCNTIP (procalcitonin:4,lacticidven:4) )No results found for this or any previous visit (from the past 240 hour(s)).   Radiological Exams on Admission: Ct Abdomen Pelvis Wo Contrast  Result Date: 02/04/2016 CLINICAL DATA:  75 year old female with recent history of abdominal pain, with some vomiting and diarrhea yesterday. Lethargy and disorientation. EXAM: CT ABDOMEN AND PELVIS WITHOUT CONTRAST TECHNIQUE:  Multidetector CT imaging of the abdomen and pelvis was performed following the standard protocol without IV contrast. COMPARISON:  No priors. FINDINGS: Lower chest: Atherosclerotic calcifications in the left circumflex and right coronary arteries. Calcifications of the mitral annulus and the aortic root. Mild scarring throughout the visualize lung bases. Hepatobiliary: No definite cystic or solid hepatic lesions identified on today's noncontrast CT examination. Tiny calcified granuloma in the right lobe of the liver. Numerous calcified gallstones lying dependently in the gallbladder. No findings to suggest an acute cholecystitis at this time. Pancreas: No discrete pancreatic mass confidently identified on today's noncontrast CT examination. However, there are extensive inflammatory changes noted throughout the retroperitoneum adjacent to the spleen, concerning for an acute pancreatitis. No well-defined peripancreatic fluid collection is noted to strongly suggest a pancreatic pseudocyst at this time. Spleen: Unremarkable. Adrenals/Urinary Tract: Unenhanced appearance of the kidneys and bilateral adrenal glands is unremarkable. No hydroureteronephrosis. Urinary bladder is normal in appearance. Stomach/Bowel: Unenhanced appearance of the stomach is normal. There is no pathologic dilatation of small bowel or colon. Normal appendix. Rectum is moderately distended with well-formed stool, exerting mass effect upon adjacent structures. Vascular/Lymphatic: Aortic atherosclerosis, without definite aneurysm in  the abdominal or pelvic vasculature. No definite lymphadenopathy is noted in the abdomen or pelvis. Reproductive: Uterus and ovaries are atrophic. Importantly, the vagina appears partially distended by what appears to be feculent contents, raising concern for potential rectovaginal fistula. Other: No significant volume of ascites.  No pneumoperitoneum. Musculoskeletal: There are no aggressive appearing lytic or blastic  lesions noted in the visualized portions of the skeleton. IMPRESSION: 1. Imaging findings are concerning for an acute pancreatitis. 2. Cholelithiasis without evidence to suggest an acute cholecystitis at this time. 3. The vagina appears partially distended with what appears to be feculent contents. This raises concern for potential rectovaginal fistula. Clinical correlation is suggested. 4. Aortic atherosclerosis, in addition to at least 2 vessel coronary artery disease. Assessment for potential risk factor modification, dietary therapy or pharmacologic therapy may be warranted, if clinically indicated. 5. Additional incidental findings, as above. Electronically Signed   By: Vinnie Langton M.D.   On: 02/04/2016 15:00   Dg Chest 2 View  Result Date: 02/04/2016 CLINICAL DATA:  tachycardia, disoriented EXAM: CHEST  2 VIEW COMPARISON:  None. FINDINGS: Cardiomediastinal silhouette is unremarkable. No acute infiltrate or pleural effusion. No pulmonary edema. Mild degenerative changes thoracic spine. IMPRESSION: No active cardiopulmonary disease. Electronically Signed   By: Lahoma Crocker M.D.   On: 02/04/2016 12:32    EKG: Independently reviewed. Sinus tachy, RAD, possible LVH- mild diffuse ST depressions  Assessment/Plan Principal Problem:   Acute pancreatitis  - ? Gallstone related- check Triglycerides - family states she drinks only a small amount each day - NPO, IVF, antiemetics, dose not appear to be in much pain  Active Problems:    Renal failure - possibly chronic in relation to HTN- follow  HTN - likely chronic- no associated symptoms such as headache, other neurological symptoms or chest pain  - start Lopressor and Norvasc  Mild acidosis - lactic acidosis vs AKI related- follow  Leukocytosis - likely stress demargination    Lactic acidosis - has improved  Tachycardia - likely due to acute pancreatitis  Elevated D dimer - d dimer 14- suspect due to acute pancreatitis- will hold  off on further work up  [employer] term memory loss - dementia? - check B12, RPR  Stool in vagina on CT? - will need CT with contrast if she has constant stool leakage      DVT prophylaxis: Lovenox Code Status: Full code Family Communication: neice Disposition Plan: admit to tele  Consults called: gen surgery  Admission status: admit    [facility] MD [facility] Pager: [email or web address] Password [email or web address] 7PM-7AM, please contact night-coverage   02/04/2016, 5:31 PM

## 2016-02-04 NOTE — Progress Notes (Signed)
75 yr old medicare pt without a pcp listed  Pt noted to be sitting up in bed without HOB raised Pt unable to provide most answers to Cm  Pt with 2 Female visitor and a female visitor at bedside Female visitor confirms pt without pcp C/o sleeping more than usual and not eating as much for some time, abdominal pain with elevated labs and bp in [facility] ED  Pt and family given a list of medicare providers within zip code [phone] and a list of doctors that visit at home

## 2016-02-04 NOTE — ED Triage Notes (Addendum)
Family reports pt has been sleeping more than usual and not eating as much for some time. Yesterday pt complained of abd pain. V/d yesterday. Pt disoriented to time. Family reports pt is normally a little more alert.

## 2016-02-04 NOTE — ED Notes (Addendum)
Tegeler, MD notified of Vitals.

## 2016-02-04 NOTE — ED Notes (Signed)
Pt. Unable to urinate at this time. Will collect urine when pt. Voids. Nurse aware.  

## 2016-02-05 LAB — BASIC METABOLIC PANEL
Anion gap: 14 (ref 5–15)
BUN: 29 mg/dL — AB (ref 6–20)
CALCIUM: 9.2 mg/dL (ref 8.9–10.3)
CHLORIDE: 108 mmol/L (ref 101–111)
CO2: 18 mmol/L — ABNORMAL LOW (ref 22–32)
CREATININE: 2.7 mg/dL — AB (ref 0.44–1.00)
GFR, EST AFRICAN AMERICAN: 19 mL/min — AB (ref >60)
GFR, EST NON AFRICAN AMERICAN: 16 mL/min — AB (ref >60)
Glucose, Bld: 130 mg/dL — ABNORMAL HIGH (ref 65–99)
Potassium: 3.5 mmol/L (ref 3.5–5.1)
SODIUM: 140 mmol/L (ref 135–145)

## 2016-02-05 LAB — CBC
HCT: 36 % (ref 36.0–46.0)
Hemoglobin: 12.5 g/dL (ref 12.0–15.0)
MCH: 34.6 pg — ABNORMAL HIGH (ref 26.0–34.0)
MCHC: 34.7 g/dL (ref 30.0–36.0)
MCV: 99.7 fL (ref 78.0–100.0)
PLATELETS: 208 10*3/uL (ref 150–400)
RBC: 3.61 MIL/uL — ABNORMAL LOW (ref 3.87–5.11)
RDW: 13.9 % (ref 11.5–15.5)
WBC: 9.2 10*3/uL (ref 4.0–10.5)

## 2016-02-05 LAB — LACTIC ACID, PLASMA
Lactic Acid, Venous: 3.2 mmol/L (ref 0.5–1.9)
Lactic Acid, Venous: 2.3 mmol/L (ref 0.5–1.9)
Lactic Acid, Venous: 1.3 mmol/L (ref 0.5–1.9)

## 2016-02-05 LAB — COMPREHENSIVE METABOLIC PANEL
ALBUMIN: 3.2 g/dL — AB (ref 3.5–5.0)
ALT: 9 U/L — ABNORMAL LOW (ref 14–54)
ANION GAP: 9 (ref 5–15)
AST: 32 U/L (ref 15–41)
Alkaline Phosphatase: 46 U/L (ref 38–126)
BILIRUBIN TOTAL: 1 mg/dL (ref 0.3–1.2)
BUN: 29 mg/dL — ABNORMAL HIGH (ref 6–20)
CHLORIDE: 110 mmol/L (ref 101–111)
CO2: 20 mmol/L — ABNORMAL LOW (ref 22–32)
Calcium: 8.3 mg/dL — ABNORMAL LOW (ref 8.9–10.3)
Creatinine, Ser: 2.65 mg/dL — ABNORMAL HIGH (ref 0.44–1.00)
GFR calc Af Amer: 19 mL/min — ABNORMAL LOW (ref >60)
GFR calc non Af Amer: 17 mL/min — ABNORMAL LOW (ref >60)
GLUCOSE: 157 mg/dL — AB (ref 65–99)
POTASSIUM: 3.7 mmol/L (ref 3.5–5.1)
SODIUM: 139 mmol/L (ref 135–145)
TOTAL PROTEIN: 6.7 g/dL (ref 6.5–8.1)

## 2016-02-05 LAB — GLUCOSE, CAPILLARY: GLUCOSE-CAPILLARY: 114 mg/dL — AB (ref 65–99)

## 2016-02-05 LAB — TRIGLYCERIDES: Triglycerides: 158 mg/dL — ABNORMAL HIGH (ref <150)

## 2016-02-05 LAB — HIV ANTIBODY (ROUTINE TESTING W REFLEX): HIV Screen 4th Generation wRfx: NONREACTIVE

## 2016-02-05 LAB — RAPID HIV SCREEN (HIV 1/2 AB+AG)
HIV 1/2 ANTIBODIES: NONREACTIVE
HIV-1 P24 ANTIGEN - HIV24: NONREACTIVE

## 2016-02-05 LAB — URINE CULTURE: Culture: NO GROWTH

## 2016-02-05 LAB — RPR: RPR: NONREACTIVE

## 2016-02-05 LAB — VITAMIN B12: Vitamin B-12: 202 pg/mL (ref 180–914)

## 2016-02-05 LAB — LIPASE, BLOOD: LIPASE: 1679 U/L — AB (ref 11–51)

## 2016-02-05 LAB — TROPONIN I

## 2016-02-05 MED ORDER — ATORVASTATIN CALCIUM 40 MG PO TABS
40.0000 mg | ORAL_TABLET | Freq: Every day | ORAL | Status: DC
  Administered 2016-02-05 – 2016-02-11 (×6): 40 mg via ORAL
  Filled 2016-02-05 (×6): qty 1

## 2016-02-05 MED ORDER — HYDRALAZINE HCL 25 MG PO TABS
25.0000 mg | ORAL_TABLET | Freq: Three times a day (TID) | ORAL | Status: DC
  Administered 2016-02-05 (×2): 25 mg via ORAL
  Filled 2016-02-05 (×2): qty 1

## 2016-02-05 MED ORDER — ISOSORBIDE MONONITRATE ER 30 MG PO TB24
30.0000 mg | ORAL_TABLET | Freq: Every day | ORAL | Status: DC
  Administered 2016-02-05: 30 mg via ORAL
  Filled 2016-02-05: qty 1

## 2016-02-05 MED ORDER — LABETALOL HCL 100 MG PO TABS
200.0000 mg | ORAL_TABLET | Freq: Two times a day (BID) | ORAL | Status: DC
  Administered 2016-02-05: 200 mg via ORAL
  Filled 2016-02-05: qty 2

## 2016-02-05 MED ORDER — INSULIN ASPART 100 UNIT/ML ~~LOC~~ SOLN
0.0000 [IU] | Freq: Three times a day (TID) | SUBCUTANEOUS | Status: DC
  Administered 2016-02-06: 1 [IU] via SUBCUTANEOUS

## 2016-02-05 MED ORDER — CYANOCOBALAMIN 1000 MCG/ML IJ SOLN
1000.0000 ug | Freq: Once | INTRAMUSCULAR | Status: AC
  Administered 2016-02-05: 1000 ug via INTRAMUSCULAR
  Filled 2016-02-05: qty 1

## 2016-02-05 MED ORDER — SODIUM CHLORIDE 0.9 % IV BOLUS (SEPSIS)
1000.0000 mL | Freq: Once | INTRAVENOUS | Status: AC
  Administered 2016-02-05: 1000 mL via INTRAVENOUS

## 2016-02-05 MED ORDER — HYDRALAZINE HCL 20 MG/ML IJ SOLN
10.0000 mg | INTRAMUSCULAR | Status: DC | PRN
  Administered 2016-02-07: 10 mg via INTRAVENOUS
  Filled 2016-02-05: qty 1

## 2016-02-05 MED ORDER — BISACODYL 10 MG RE SUPP
10.0000 mg | Freq: Once | RECTAL | Status: AC
  Administered 2016-02-05: 10 mg via RECTAL
  Filled 2016-02-05: qty 1

## 2016-02-05 NOTE — Progress Notes (Signed)
PROGRESS NOTE  Kristine Shaffer  [id number] DOB: 28-Mar-1941 DOA: 02/04/2016 PCP: No primary care provider on file.  Brief Narrative:   Kristine Shaffer is a 75 y.o. female with who has not seen a doctor in a long time and has no past medical history who presented with poor appetite for many years and intermittent abdominal pain for the last month.  On the day of admission, she developed nausea and nonbloody emesis.  In the ER, Lipase-5081, CT abdomen and pelvis demonstrated acute pancreatitis, cholelithiasis.  Incidentally, she was very hypertensive, her creatinine was elevated at 2.88 with unknown baseline, and her CT showed that her vagina appeared distended with possible fecal contents.    Assessment & Plan:   Principal Problem:   Acute pancreatitis Active Problems:   Renal failure   Lactic acidosis   Benign essential HTN   Acute pancreatitis, improving - continue healthy heart diet - continue IVF -  Lipase trending down and abdominal pain improving  Acute kidney injury vs. Chronic kidney disease, creatinine trended down only slightly -  Continue IVF -  No evidence of obstruction on CT -  Minimize nephrotoxins and renally dose medications  Essential hypertension, blood pressure dropped precipitously -  D/c hydral/imdur, norvasc, and labetalol -  Start prn IV hydralazine   ECG changes and coronary calcifications seen on CT. Diffuse ST segment depressions now resolved -  Check troponin -  ECHO -  Start statin -  Check lipid panel  Hyperglycemia -  A1c  Mild acidosis, recurrent lactic acidosis -  NS bolus x 2 and increase IVF -  Repeat lactic acid in 3 hours  Leukocytosis, resolved  Tachycardia, resolved with IVF  Elevated D dimer, suspect due to acute pancreatitis- hold off on further work up  Kristine Shaffer term memory loss and probable dementia - B12 borderline low, RPR NR  Possible stool in vagina on CT, but no odor and no stool reported by RN.   - Will perform  bedside exam tomorrow  DVT prophylaxis:  lovenox Code Status:  full Family Communication:  Patient, her two nieces, and patient's sister.  Need to notify Gwendolyn on a daily basis as patient does not have capacity to make her own medical decisions. Disposition Plan:  Need to determine if kidney disease is acute or chronic, stabilize blood pressures, see that patient is tolerating a diet once lipase trended down.  Will need outpatient follow up with cardiology, nephrology.  Will need cholecystectomy once pancreatitis has improved.     Consultants:   General surgery  Procedures:  none  Antimicrobials:   none    Subjective: Denies chest pains at rest or with exertion.  Uses rolling walker for ambulation.  Gets SOB when ambulating.  Poor appetite for a long time.  Abdominal pains are periumbilical  Objective: Vitals:   02/05/16 1431 02/05/16 1554 02/05/16 1600 02/05/16 1604  BP: (!) 90/57 106/60 102/64 100/60  Pulse: 80 74    Resp: 16     Temp: 97.4 F (36.3 C)     TempSrc: Oral     SpO2: 95%     Weight:      Height:        Intake/Output Summary (Last 24 hours) at 02/05/16 1701 Last data filed at 02/05/16 1432  Gross per 24 hour  Intake          1417.08 ml  Output              750 ml  Net  667.08 ml   Filed Weights   02/04/16 1907  Weight: 44.4 kg (97 lb 14.2 oz)    Examination:  General exam:  Adult female, thin.  No acute distress.  HEENT:  NCAT, MMM Respiratory system: Clear to auscultation bilaterally Cardiovascular system: Regular rate and rhythm, normal S1/S2. No murmurs, rubs, gallops or clicks.  Warm extremities Gastrointestinal system: Normal active bowel sounds, soft, mildly distended, TTP in the upper quadrants without rebound or guarding. MSK:  Normal tone and bulk, no lower extremity edema Neuro:  Grossly intact    Data Reviewed: I have personally reviewed following labs and imaging studies  CBC:  Recent Labs Lab 02/04/16 1205  02/05/16 1625  WBC 13.5* 9.2  NEUTROABS 10.8*  --   HGB 15.6* 12.5  HCT 44.3 36.0  MCV 99.8 99.7  PLT 255 [id number]   Basic Metabolic Panel:  Recent Labs Lab 02/04/16 1205 02/05/16 0428  NA 135 140  K 4.3 3.5  CL 102 108  CO2 18* 18*  GLUCOSE 177* 130*  BUN 28* 29*  CREATININE 2.88* 2.70*  CALCIUM 9.1 9.2   GFR: Estimated Creatinine Clearance: 12.6 mL/min (by C-G formula based on SCr of 2.7 mg/dL (H)). Liver Function Tests:  Recent Labs Lab 02/04/16 1205  AST 23  ALT 8*  ALKPHOS 53  BILITOT 1.0  PROT 7.8  ALBUMIN 3.8    Recent Labs Lab 02/04/16 1205 02/05/16 0428  LIPASE 5,081* 1,679*   No results for input(s): AMMONIA in the last 168 hours. Coagulation Profile: No results for input(s): INR, PROTIME in the last 168 hours. Cardiac Enzymes: No results for input(s): CKTOTAL, CKMB, CKMBINDEX, TROPONINI in the last 168 hours. BNP (last 3 results) No results for input(s): PROBNP in the last 8760 hours. HbA1C: No results for input(s): HGBA1C in the last 72 hours. CBG: No results for input(s): GLUCAP in the last 168 hours. Lipid Profile:  Recent Labs  02/05/16 0428  TRIG 158*   Thyroid Function Tests: No results for input(s): TSH, T4TOTAL, FREET4, T3FREE, THYROIDAB in the last 72 hours. Anemia Panel:  Recent Labs  02/05/16 0428  VITAMINB12 202   Urine analysis:    Component Value Date/Time   COLORURINE AMBER (A) 02/04/2016 1103   APPEARANCEUR TURBID (A) 02/04/2016 1103   LABSPEC 1.020 02/04/2016 1103   PHURINE 5.5 02/04/2016 1103   GLUCOSEU NEGATIVE 02/04/2016 1103   HGBUR MODERATE (A) 02/04/2016 1103   BILIRUBINUR LARGE (A) 02/04/2016 1103   KETONESUR 40 (A) 02/04/2016 1103   PROTEINUR 100 (A) 02/04/2016 1103   NITRITE NEGATIVE 02/04/2016 1103   LEUKOCYTESUR NEGATIVE 02/04/2016 1103   Sepsis Labs: @LABRCNTIP (procalcitonin:4,lacticidven:4)  ) Recent Results (from the past 240 hour(s))  Urine culture     Status: None   Collection Time:  02/04/16 11:03 AM  Result Value Ref Range Status   Specimen Description URINE, RANDOM  Final   Special Requests NONE  Final   Culture   Final    NO GROWTH 1 DAY Performed at [facility]    Report Status 02/05/2016 FINAL  Final  Blood culture (routine x 2)     Status: None (Preliminary result)   Collection Time: 02/04/16 11:25 AM  Result Value Ref Range Status   Specimen Description BLOOD RIGHT FOREARM  Final   Special Requests IN PEDIATRIC BOTTLE 3CC  Final   Culture   Final    NO GROWTH < 24 HOURS Performed at [facility]    Report Status PENDING  Incomplete  Blood culture (routine x 2)     Status: None (Preliminary result)   Collection Time: 02/04/16 12:50 PM  Result Value Ref Range Status   Specimen Description BLOOD RIGHT HAND  Final   Special Requests IN PEDIATRIC BOTTLE 2CC  Final   Culture   Final    NO GROWTH < 24 HOURS Performed at [facility]    Report Status PENDING  Incomplete      Radiology Studies: Ct Abdomen Pelvis Wo Contrast  Result Date: 02/04/2016 CLINICAL DATA:  75 year old female with recent history of abdominal pain, with some vomiting and diarrhea yesterday. Lethargy and disorientation. EXAM: CT ABDOMEN AND PELVIS WITHOUT CONTRAST TECHNIQUE: Multidetector CT imaging of the abdomen and pelvis was performed following the standard protocol without IV contrast. COMPARISON:  No priors. FINDINGS: Lower chest: Atherosclerotic calcifications in the left circumflex and right coronary arteries. Calcifications of the mitral annulus and the aortic root. Mild scarring throughout the visualize lung bases. Hepatobiliary: No definite cystic or solid hepatic lesions identified on today's noncontrast CT examination. Tiny calcified granuloma in the right lobe of the liver. Numerous calcified gallstones lying dependently in the gallbladder. No findings to suggest an acute cholecystitis at this time. Pancreas: No discrete pancreatic mass confidently  identified on today's noncontrast CT examination. However, there are extensive inflammatory changes noted throughout the retroperitoneum adjacent to the spleen, concerning for an acute pancreatitis. No well-defined peripancreatic fluid collection is noted to strongly suggest a pancreatic pseudocyst at this time. Spleen: Unremarkable. Adrenals/Urinary Tract: Unenhanced appearance of the kidneys and bilateral adrenal glands is unremarkable. No hydroureteronephrosis. Urinary bladder is normal in appearance. Stomach/Bowel: Unenhanced appearance of the stomach is normal. There is no pathologic dilatation of small bowel or colon. Normal appendix. Rectum is moderately distended with well-formed stool, exerting mass effect upon adjacent structures. Vascular/Lymphatic: Aortic atherosclerosis, without definite aneurysm in the abdominal or pelvic vasculature. No definite lymphadenopathy is noted in the abdomen or pelvis. Reproductive: Uterus and ovaries are atrophic. Importantly, the vagina appears partially distended by what appears to be feculent contents, raising concern for potential rectovaginal fistula. Other: No significant volume of ascites.  No pneumoperitoneum. Musculoskeletal: There are no aggressive appearing lytic or blastic lesions noted in the visualized portions of the skeleton. IMPRESSION: 1. Imaging findings are concerning for an acute pancreatitis. 2. Cholelithiasis without evidence to suggest an acute cholecystitis at this time. 3. The vagina appears partially distended with what appears to be feculent contents. This raises concern for potential rectovaginal fistula. Clinical correlation is suggested. 4. Aortic atherosclerosis, in addition to at least 2 vessel coronary artery disease. Assessment for potential risk factor modification, dietary therapy or pharmacologic therapy may be warranted, if clinically indicated. 5. Additional incidental findings, as above. Electronically Signed   By: Vinnie Langton  M.D.   On: 02/04/2016 15:00   Dg Chest 2 View  Result Date: 02/04/2016 CLINICAL DATA:  tachycardia, disoriented EXAM: CHEST  2 VIEW COMPARISON:  None. FINDINGS: Cardiomediastinal silhouette is unremarkable. No acute infiltrate or pleural effusion. No pulmonary edema. Mild degenerative changes thoracic spine. IMPRESSION: No active cardiopulmonary disease. Electronically Signed   By: Lahoma Crocker M.D.   On: 02/04/2016 12:32     Scheduled Meds: . amLODipine  10 mg Oral Daily  . bisacodyl  10 mg Rectal Once  . enoxaparin (LOVENOX) injection  30 mg Subcutaneous QHS  . labetalol  200 mg Oral BID  .  morphine injection  4 mg Intravenous Once   Continuous Infusions: . dextrose 5 %  and 0.9% NaCl 125 mL/hr (02/05/16 1036)     LOS: 1 day    Time spent: 30 min    Janece Canterbury, MD [facility] Pager [phone]  If 7PM-7AM, please contact night-coverage [email or web address] Password [email or web address] 02/05/2016, 5:01 PM

## 2016-02-05 NOTE — Progress Notes (Signed)
Patient ID: Kristine Shaffer, female   DOB: 06-01-1940, 75 y.o.   MRN: [id number] [facility] Surgery Progress Note:   * No surgery found *  Subjective: Mental status is clear.  No prior abdominal surgery Objective: Vital signs in last 24 hours: Temp:  [98.4 F (36.9 C)-99.8 F (37.7 C)] 98.5 F (36.9 C) (10/19 2205) Pulse Rate:  [82-110] 89 (10/20 0912) Resp:  [16-26] 24 (10/19 2205) BP: (181-258)/(83-138) 181/83 (10/20 0912) SpO2:  [99 %-100 %] 99 % (10/19 2205) Weight:  [44.4 kg (97 lb 14.2 oz)] 44.4 kg (97 lb 14.2 oz) (10/19 1907)  Intake/Output from previous day: 10/19 0701 - 10/20 0700 In: 2177.1 [I.V.:1177.1; IV Piggyback:1000] Out: 750 [Urine:750] Intake/Output this shift: Total I/O In: 120 [P.O.:120] Out: -   Physical Exam: Work of breathing is not labored.  Not acute abdomen at present  Lab Results:  Results for orders placed or performed during the hospital encounter of 02/04/16 (from the past 48 hour(s))  Urinalysis, Routine w reflex microscopic (not at [facility])     Status: Abnormal   Collection Time: 02/04/16 11:03 AM  Result Value Ref Range   Color, Urine AMBER (A) YELLOW    Comment: BIOCHEMICALS MAY BE AFFECTED BY COLOR   APPearance TURBID (A) CLEAR   Specific Gravity, Urine 1.020 1.005 - 1.030   pH 5.5 5.0 - 8.0   Glucose, UA NEGATIVE NEGATIVE mg/dL   Hgb urine dipstick MODERATE (A) NEGATIVE   Bilirubin Urine LARGE (A) NEGATIVE   Ketones, ur 40 (A) NEGATIVE mg/dL   Protein, ur 100 (A) NEGATIVE mg/dL   Nitrite NEGATIVE NEGATIVE   Leukocytes, UA NEGATIVE NEGATIVE  Urine microscopic-add on     Status: Abnormal   Collection Time: 02/04/16 11:03 AM  Result Value Ref Range   Squamous Epithelial / LPF 0-5 (A) NONE SEEN   WBC, UA 0-5 0 - 5 WBC/hpf   RBC / HPF 6-30 0 - 5 RBC/hpf   Bacteria, UA MANY (A) NONE SEEN   Urine-Other MUCOUS PRESENT   CBC with Differential     Status: Abnormal   Collection Time: 02/04/16 12:05 PM  Result Value Ref Range   WBC 13.5 (H)  4.0 - 10.5 K/uL   RBC 4.44 3.87 - 5.11 MIL/uL   Hemoglobin 15.6 (H) 12.0 - 15.0 g/dL   HCT 44.3 36.0 - 46.0 %   MCV 99.8 78.0 - 100.0 fL   MCH 35.1 (H) 26.0 - 34.0 pg   MCHC 35.2 30.0 - 36.0 g/dL   RDW 14.1 11.5 - 15.5 %   Platelets 255 150 - 400 K/uL   Neutrophils Relative % 80 %   Neutro Abs 10.8 (H) 1.7 - 7.7 K/uL   Lymphocytes Relative 6 %   Lymphs Abs 0.8 0.7 - 4.0 K/uL   Monocytes Relative 14 %   Monocytes Absolute 1.9 (H) 0.1 - 1.0 K/uL   Eosinophils Relative 0 %   Eosinophils Absolute 0.0 0.0 - 0.7 K/uL   Basophils Relative 0 %   Basophils Absolute 0.0 0.0 - 0.1 K/uL  Comprehensive metabolic panel     Status: Abnormal   Collection Time: 02/04/16 12:05 PM  Result Value Ref Range   Sodium 135 135 - 145 mmol/L   Potassium 4.3 3.5 - 5.1 mmol/L   Chloride 102 101 - 111 mmol/L   CO2 18 (L) 22 - 32 mmol/L   Glucose, Bld 177 (H) 65 - 99 mg/dL   BUN 28 (H) 6 - 20 mg/dL  Creatinine, Ser 2.88 (H) 0.44 - 1.00 mg/dL   Calcium 9.1 8.9 - 10.3 mg/dL   Total Protein 7.8 6.5 - 8.1 g/dL   Albumin 3.8 3.5 - 5.0 g/dL   AST 23 15 - 41 U/L   ALT 8 (L) 14 - 54 U/L   Alkaline Phosphatase 53 38 - 126 U/L   Total Bilirubin 1.0 0.3 - 1.2 mg/dL   GFR calc non Af Amer 15 (L) >60 mL/min   GFR calc Af Amer 17 (L) >60 mL/min    Comment: (NOTE) The eGFR has been calculated using the CKD EPI equation. This calculation has not been validated in all clinical situations. eGFR's persistently <60 mL/min signify possible Chronic Kidney Disease.    Anion gap 15 5 - 15  Lipase, blood     Status: Abnormal   Collection Time: 02/04/16 12:05 PM  Result Value Ref Range   Lipase 5,081 (H) 11 - 51 U/L    Comment: RESULTS CONFIRMED BY MANUAL DILUTION  I-Stat CG4 Lactic Acid, ED     Status: Abnormal   Collection Time: 02/04/16 12:15 PM  Result Value Ref Range   Lactic Acid, Venous 3.37 (HH) 0.5 - 1.9 mmol/L   Comment NOTIFIED PHYSICIAN   I-Stat Troponin, ED (not at [facility])     Status: None   Collection  Time: 02/04/16 12:19 PM  Result Value Ref Range   Troponin i, poc 0.04 0.00 - 0.08 ng/mL   Comment 3            Comment: Due to the release kinetics of cTnI, a negative result within the first hours of the onset of symptoms does not rule out myocardial infarction with certainty. If myocardial infarction is still suspected, repeat the test at appropriate intervals.   D-dimer, quantitative (not at [facility])     Status: Abnormal   Collection Time: 02/04/16 12:50 PM  Result Value Ref Range   D-Dimer, Quant 14.54 (H) 0.00 - 0.50 ug/mL-FEU    Comment: (NOTE) At the manufacturer cut-off of 0.50 ug/mL FEU, this assay has been documented to exclude PE with a sensitivity and negative predictive value of 97 to 99%.  At this time, this assay has not been approved by the FDA to exclude DVT/VTE. Results should be correlated with clinical presentation.   I-Stat CG4 Lactic Acid, ED     Status: None   Collection Time: 02/04/16  3:14 PM  Result Value Ref Range   Lactic Acid, Venous 0.79 0.5 - 1.9 mmol/L  Basic metabolic panel     Status: Abnormal   Collection Time: 02/05/16  4:28 AM  Result Value Ref Range   Sodium 140 135 - 145 mmol/L   Potassium 3.5 3.5 - 5.1 mmol/L    Comment: DELTA CHECK NOTED NO VISIBLE HEMOLYSIS    Chloride 108 101 - 111 mmol/L   CO2 18 (L) 22 - 32 mmol/L   Glucose, Bld 130 (H) 65 - 99 mg/dL   BUN 29 (H) 6 - 20 mg/dL   Creatinine, Ser 2.70 (H) 0.44 - 1.00 mg/dL   Calcium 9.2 8.9 - 10.3 mg/dL   GFR calc non Af Amer 16 (L) >60 mL/min   GFR calc Af Amer 19 (L) >60 mL/min    Comment: (NOTE) The eGFR has been calculated using the CKD EPI equation. This calculation has not been validated in all clinical situations. eGFR's persistently <60 mL/min signify possible Chronic Kidney Disease.    Anion gap 14 5 - 15  Rapid HIV  screen (HIV 1/2 Ab+Ag) ([facility] Only)     Status: None   Collection Time: 02/05/16  4:28 AM  Result Value Ref Range   HIV-1 P24 Antigen - HIV24 NON REACTIVE  NON REACTIVE    Comment: RESULT CALLED TO, READ BACK BY AND VERIFIED WITH: F.WILFONG,RN 0550 02/05/16 [facility]    HIV 1/2 Antibodies NON REACTIVE NON REACTIVE    Comment: RESULT CALLED TO, READ BACK BY AND VERIFIED WITH: F.WILFONG,RN 0550 02/05/16 [facility]    Interpretation (HIV Ag Ab)      A non reactive test result means that HIV 1 or HIV 2 antibodies and HIV 1 p24 antigen were not detected in the specimen.  Triglycerides     Status: Abnormal   Collection Time: 02/05/16  4:28 AM  Result Value Ref Range   Triglycerides 158 (H) <150 mg/dL    Comment: Performed at [facility]  Vitamin B12     Status: None   Collection Time: 02/05/16  4:28 AM  Result Value Ref Range   Vitamin B-12 202 180 - 914 pg/mL    Comment: (NOTE) This assay is not validated for testing neonatal or myeloproliferative syndrome specimens for Vitamin B12 levels. Performed at [facility]   Lipase, blood     Status: Abnormal   Collection Time: 02/05/16  4:28 AM  Result Value Ref Range   Lipase 1,679 (H) 11 - 51 U/L    Comment: RESULTS CONFIRMED BY MANUAL DILUTION    Radiology/Results: Ct Abdomen Pelvis Wo Contrast  Result Date: 02/04/2016 CLINICAL DATA:  75 year old female with recent history of abdominal pain, with some vomiting and diarrhea yesterday. Lethargy and disorientation. EXAM: CT ABDOMEN AND PELVIS WITHOUT CONTRAST TECHNIQUE: Multidetector CT imaging of the abdomen and pelvis was performed following the standard protocol without IV contrast. COMPARISON:  No priors. FINDINGS: Lower chest: Atherosclerotic calcifications in the left circumflex and right coronary arteries. Calcifications of the mitral annulus and the aortic root. Mild scarring throughout the visualize lung bases. Hepatobiliary: No definite cystic or solid hepatic lesions identified on today's noncontrast CT examination. Tiny calcified granuloma in the right lobe of the liver. Numerous calcified gallstones lying dependently in the  gallbladder. No findings to suggest an acute cholecystitis at this time. Pancreas: No discrete pancreatic mass confidently identified on today's noncontrast CT examination. However, there are extensive inflammatory changes noted throughout the retroperitoneum adjacent to the spleen, concerning for an acute pancreatitis. No well-defined peripancreatic fluid collection is noted to strongly suggest a pancreatic pseudocyst at this time. Spleen: Unremarkable. Adrenals/Urinary Tract: Unenhanced appearance of the kidneys and bilateral adrenal glands is unremarkable. No hydroureteronephrosis. Urinary bladder is normal in appearance. Stomach/Bowel: Unenhanced appearance of the stomach is normal. There is no pathologic dilatation of small bowel or colon. Normal appendix. Rectum is moderately distended with well-formed stool, exerting mass effect upon adjacent structures. Vascular/Lymphatic: Aortic atherosclerosis, without definite aneurysm in the abdominal or pelvic vasculature. No definite lymphadenopathy is noted in the abdomen or pelvis. Reproductive: Uterus and ovaries are atrophic. Importantly, the vagina appears partially distended by what appears to be feculent contents, raising concern for potential rectovaginal fistula. Other: No significant volume of ascites.  No pneumoperitoneum. Musculoskeletal: There are no aggressive appearing lytic or blastic lesions noted in the visualized portions of the skeleton. IMPRESSION: 1. Imaging findings are concerning for an acute pancreatitis. 2. Cholelithiasis without evidence to suggest an acute cholecystitis at this time. 3. The vagina appears partially distended with what appears to be feculent contents. This raises  concern for potential rectovaginal fistula. Clinical correlation is suggested. 4. Aortic atherosclerosis, in addition to at least 2 vessel coronary artery disease. Assessment for potential risk factor modification, dietary therapy or pharmacologic therapy may be  warranted, if clinically indicated. 5. Additional incidental findings, as above. Electronically Signed   By: Vinnie Langton M.D.   On: 02/04/2016 15:00   Dg Chest 2 View  Result Date: 02/04/2016 CLINICAL DATA:  tachycardia, disoriented EXAM: CHEST  2 VIEW COMPARISON:  None. FINDINGS: Cardiomediastinal silhouette is unremarkable. No acute infiltrate or pleural effusion. No pulmonary edema. Mild degenerative changes thoracic spine. IMPRESSION: No active cardiopulmonary disease. Electronically Signed   By: Lahoma Crocker M.D.   On: 02/04/2016 12:32    Anti-infectives: Anti-infectives    Start     Dose/Rate Route Frequency Ordered Stop   02/04/16 1345  piperacillin-tazobactam (ZOSYN) IVPB 3.375 g     3.375 g 100 mL/hr over 30 Minutes Intravenous  Once 02/04/16 1341 02/04/16 1450      Assessment/Plan: Problem List: Patient Active Problem List   Diagnosis Date Noted  . Acute pancreatitis 02/04/2016  . Renal failure 02/04/2016  . Lactic acidosis 02/04/2016  . Benign essential HTN     Lipase 1600 this am.  Clinically better.  Will follow resolution of pancreatitis  * No surgery found *    LOS: 1 day   Matt B. Hassell Done, MD, [facility] [phone] beeper [phone]  02/05/2016 10:26 AM

## 2016-02-05 NOTE — Progress Notes (Signed)
CRITICAL VALUE ALERT  Critical value received:  Lactic acid 3.0 Date of notification: 02/05/16 Time of notification:  1713  Critical value read back:yes  Nurse who received alert: Westly Pam Rn MD notified (1st page):  Dr. Jearld Adjutant  Time of first page:  1715  MD notified (2nd page):  Time of second page:  Responding MD:  Dr M.Short  Time MD responded:  [phone]

## 2016-02-06 ENCOUNTER — Inpatient Hospital Stay (HOSPITAL_COMMUNITY): Payer: Medicare Other

## 2016-02-06 DIAGNOSIS — I360 Nonrheumatic tricuspid (valve) stenosis: Secondary | ICD-10-CM

## 2016-02-06 LAB — GLUCOSE, CAPILLARY
GLUCOSE-CAPILLARY: 133 mg/dL — AB (ref 65–99)
GLUCOSE-CAPILLARY: 113 mg/dL — AB (ref 65–99)
GLUCOSE-CAPILLARY: 112 mg/dL — AB (ref 65–99)
Glucose-Capillary: 79 mg/dL (ref 65–99)

## 2016-02-06 LAB — COMPREHENSIVE METABOLIC PANEL
ALBUMIN: 2.6 g/dL — AB (ref 3.5–5.0)
ALT: 10 U/L — ABNORMAL LOW (ref 14–54)
ANION GAP: 7 (ref 5–15)
AST: 21 U/L (ref 15–41)
Alkaline Phosphatase: 41 U/L (ref 38–126)
BUN: 22 mg/dL — AB (ref 6–20)
CHLORIDE: 120 mmol/L — AB (ref 101–111)
CO2: 17 mmol/L — ABNORMAL LOW (ref 22–32)
Calcium: 7.3 mg/dL — ABNORMAL LOW (ref 8.9–10.3)
Creatinine, Ser: 2.23 mg/dL — ABNORMAL HIGH (ref 0.44–1.00)
GFR calc Af Amer: 24 mL/min — ABNORMAL LOW (ref >60)
GFR calc non Af Amer: 20 mL/min — ABNORMAL LOW (ref >60)
GLUCOSE: 160 mg/dL — AB (ref 65–99)
POTASSIUM: 2.9 mmol/L — AB (ref 3.5–5.1)
Sodium: 144 mmol/L (ref 135–145)
Total Bilirubin: 0.7 mg/dL (ref 0.3–1.2)
Total Protein: 5.5 g/dL — ABNORMAL LOW (ref 6.5–8.1)

## 2016-02-06 LAB — LIPASE, BLOOD: LIPASE: 774 U/L — AB (ref 11–51)

## 2016-02-06 LAB — ECHOCARDIOGRAM COMPLETE
Height: 60 in
Weight: 1566.15 oz

## 2016-02-06 LAB — CBC
HEMATOCRIT: 31 % — AB (ref 36.0–46.0)
HEMOGLOBIN: 10.7 g/dL — AB (ref 12.0–15.0)
MCH: 34.7 pg — ABNORMAL HIGH (ref 26.0–34.0)
MCHC: 34.5 g/dL (ref 30.0–36.0)
MCV: 100.6 fL — AB (ref 78.0–100.0)
Platelets: 160 10*3/uL (ref 150–400)
RBC: 3.08 MIL/uL — ABNORMAL LOW (ref 3.87–5.11)
RDW: 14.2 % (ref 11.5–15.5)
WBC: 7.7 10*3/uL (ref 4.0–10.5)

## 2016-02-06 LAB — LIPID PANEL
Cholesterol: 179 mg/dL (ref 0–200)
HDL: 38 mg/dL — AB (ref >40)
LDL CALC: 120 mg/dL — AB (ref 0–99)
Total CHOL/HDL Ratio: 4.7 RATIO
Triglycerides: 105 mg/dL (ref <150)
VLDL: 21 mg/dL (ref 0–40)

## 2016-02-06 MED ORDER — POTASSIUM CHLORIDE CRYS ER 20 MEQ PO TBCR
40.0000 meq | EXTENDED_RELEASE_TABLET | ORAL | Status: AC
  Administered 2016-02-06 (×2): 40 meq via ORAL
  Filled 2016-02-06 (×2): qty 2

## 2016-02-06 MED ORDER — POTASSIUM CHLORIDE CRYS ER 20 MEQ PO TBCR: 40.0000 meq | EXTENDED_RELEASE_TABLET | Freq: Once | ORAL | Status: DC

## 2016-02-06 MED ORDER — KCL IN DEXTROSE-NACL 20-5-0.45 MEQ/L-%-% IV SOLN
INTRAVENOUS | Status: DC
  Administered 2016-02-06 – 2016-02-07 (×4): via INTRAVENOUS
  Administered 2016-02-08 (×2): 100 mL/h via INTRAVENOUS
  Administered 2016-02-09 – 2016-02-12 (×7): via INTRAVENOUS
  Filled 2016-02-06 (×18): qty 1000

## 2016-02-06 NOTE — Progress Notes (Signed)
*  PRELIMINARY RESULTS* Echocardiogram 2D Echocardiogram has been performed.  Kristine Shaffer 02/06/2016, 2:21 PM

## 2016-02-06 NOTE — Progress Notes (Signed)
PROGRESS NOTE  Kristine Shaffer  [id number] DOB: [birth year] DOA: 02/04/2016 PCP: No primary care provider on file.  Brief Narrative:   Kristine Shaffer is a 75 y.o. female with who has not seen a doctor in a long time and has no past medical history who presented with poor appetite for many years and intermittent abdominal pain for the last month.  On the day of admission, she developed nausea and nonbloody emesis.  In the ER, Lipase-[id number], CT abdomen and pelvis demonstrated acute pancreatitis, cholelithiasis.  Incidentally, she was very hypertensive, her creatinine was elevated at 2.88 with unknown baseline, and her CT showed that her vagina appeared distended with possible fecal contents.    Assessment & Plan:   Principal Problem:   Acute pancreatitis Active Problems:   Renal failure   Lactic acidosis   Benign essential HTN   Acute pancreatitis, likely gallstone pacreatitis, improving - continue healthy heart diet - change to d5 1/2 NS IVF -  Lipase trending down and abdominal pain resolved -  Anticipate surgery prior to discharge  Acute kidney injury vs. Chronic kidney disease with nongap metabolic acidosis, creatinine trended down  -  Continue IVF -  No evidence of obstruction on CT -  Minimize nephrotoxins and renally dose medications  Essential hypertension, blood pressure dropped precipitously yesterday -  Continue prn IV hydralazine   ECG changes and coronary calcifications seen on CT. Diffuse ST segment depressions now resolved -  Check troponin negative -  ECHO pending -  Started statin, LDL 120  Hyperglycemia -  A1c pending  Lactic acidosis -  Resolved with IVF boluses  Leukocytosis, resolved  Tachycardia, resolved with IVF  Elevated D dimer, suspect due to acute pancreatitis- hold off on further work up  Deidra Spease term memory loss and probable dementia - B12 borderline low, RPR NR -  vitain b12 injection  Possible stool in vagina on CT, but no odor and  no stool reported by RN.    Hypokalemia - oral and IV potassium repletion  DVT prophylaxis:  lovenox Code Status:  full Family Communication:  Gwendolyn  Disposition Plan:  Likely cholecystectomy early this coming week.  Will need outpatient follow up with cardiology, nephrology.       Consultants:   General surgery  Procedures:  none  Antimicrobials:   none    Subjective: Denies chest pains, nausea, vomiting, and abdominal pain. Feeling well.    Objective: Vitals:   02/05/16 1731 02/05/16 1735 02/05/16 2114 02/06/16 0503  BP: (!) 111/56 100/64 120/64 135/62  Pulse: 81  88 81  Resp: 16  (!) 27 (!) 24  Temp: 98.1 F (36.7 C)  98.1 F (36.7 C) 98.3 F (36.8 C)  TempSrc: Oral  Oral Oral  SpO2:   100% 99%  Weight:      Height:        Intake/Output Summary (Last 24 hours) at 02/06/16 1317 Last data filed at 02/06/16 0854  Gross per 24 hour  Intake          3513.33 ml  Output             1000 ml  Net          2513.33 ml   Filed Weights   02/04/16 1907  Weight: 44.4 kg (97 lb 14.2 oz)    Examination:  General exam:  Adult female, thin.  No acute distress.  HEENT:  NCAT, MMM Respiratory system: Clear to auscultation bilaterally Cardiovascular system: Regular rate and rhythm,  normal S1/S2. No murmurs, rubs, gallops or clicks.  Warm extremities Gastrointestinal system: Normal active bowel sounds, soft, nondistended, nontender MSK:  Normal tone and bulk, no lower extremity edema Neuro:  Grossly intact    Data Reviewed: I have personally reviewed following labs and imaging studies  CBC:  Recent Labs Lab 02/04/16 1205 02/05/16 1625 02/06/16 0545  WBC 13.5* 9.2 7.7  NEUTROABS 10.8*  --   --   HGB 15.6* 12.5 10.7*  HCT 44.3 36.0 31.0*  MCV 99.8 99.7 100.6*  PLT 255 208 0000000   Basic Metabolic Panel:  Recent Labs Lab 02/04/16 1205 02/05/16 0428 02/05/16 1625 02/06/16 0545  NA 135 140 139 144  K 4.3 3.5 3.7 2.9*  CL 102 108 110 120*  CO2 18*  18* 20* 17*  GLUCOSE 177* 130* 157* 160*  BUN 28* 29* 29* 22*  CREATININE 2.88* 2.70* 2.65* 2.23*  CALCIUM 9.1 9.2 8.3* 7.3*   GFR: Estimated Creatinine Clearance: 15.3 mL/min (by C-G formula based on SCr of 2.23 mg/dL (H)). Liver Function Tests:  Recent Labs Lab 02/04/16 1205 02/05/16 1625 02/06/16 0545  AST 23 32 21  ALT 8* 9* 10*  ALKPHOS 53 46 41  BILITOT 1.0 1.0 0.7  PROT 7.8 6.7 5.5*  ALBUMIN 3.8 3.2* 2.6*    Recent Labs Lab 02/04/16 1205 02/05/16 0428 02/06/16 0545  LIPASE 5,081* 1,679* 774*   No results for input(s): AMMONIA in the last 168 hours. Coagulation Profile: No results for input(s): INR, PROTIME in the last 168 hours. Cardiac Enzymes:  Recent Labs Lab 02/05/16 1625  TROPONINI <0.03   BNP (last 3 results) No results for input(s): PROBNP in the last 8760 hours. HbA1C: No results for input(s): HGBA1C in the last 72 hours. CBG:  Recent Labs Lab 02/05/16 2118 02/06/16 0729 02/06/16 1142  GLUCAP 114* 133* 113*   Lipid Profile:  Recent Labs  02/05/16 0428 02/06/16 0545  CHOL  --  179  HDL  --  38*  LDLCALC  --  120*  TRIG 158* 105  CHOLHDL  --  4.7   Thyroid Function Tests: No results for input(s): TSH, T4TOTAL, FREET4, T3FREE, THYROIDAB in the last 72 hours. Anemia Panel:  Recent Labs  02/05/16 0428  VITAMINB12 202   Urine analysis:    Component Value Date/Time   COLORURINE AMBER (A) 02/04/2016 1103   APPEARANCEUR TURBID (A) 02/04/2016 1103   LABSPEC 1.020 02/04/2016 1103   PHURINE 5.5 02/04/2016 1103   GLUCOSEU NEGATIVE 02/04/2016 1103   HGBUR MODERATE (A) 02/04/2016 1103   BILIRUBINUR LARGE (A) 02/04/2016 1103   KETONESUR 40 (A) 02/04/2016 1103   PROTEINUR 100 (A) 02/04/2016 1103   NITRITE NEGATIVE 02/04/2016 1103   LEUKOCYTESUR NEGATIVE 02/04/2016 1103   Sepsis Labs: @LABRCNTIP (procalcitonin:4,lacticidven:4)  ) Recent Results (from the past 240 hour(s))  Urine culture     Status: None   Collection Time:  02/04/16 11:03 AM  Result Value Ref Range Status   Specimen Description URINE, RANDOM  Final   Special Requests NONE  Final   Culture   Final    NO GROWTH 1 DAY Performed at [facility]    Report Status 02/05/2016 FINAL  Final  Blood culture (routine x 2)     Status: None (Preliminary result)   Collection Time: 02/04/16 11:25 AM  Result Value Ref Range Status   Specimen Description BLOOD RIGHT FOREARM  Final   Special Requests IN PEDIATRIC BOTTLE [facility]  Final   Culture   Final  NO GROWTH 2 DAYS Performed at [facility]    Report Status PENDING  Incomplete  Blood culture (routine x 2)     Status: None (Preliminary result)   Collection Time: 02/04/16 12:50 PM  Result Value Ref Range Status   Specimen Description BLOOD RIGHT HAND  Final   Special Requests IN PEDIATRIC BOTTLE 2CC  Final   Culture   Final    NO GROWTH 2 DAYS Performed at [facility]    Report Status PENDING  Incomplete      Radiology Studies: Ct Abdomen Pelvis Wo Contrast  Result Date: 02/04/2016 CLINICAL DATA:  75 year old female with recent history of abdominal pain, with some vomiting and diarrhea yesterday. Lethargy and disorientation. EXAM: CT ABDOMEN AND PELVIS WITHOUT CONTRAST TECHNIQUE: Multidetector CT imaging of the abdomen and pelvis was performed following the standard protocol without IV contrast. COMPARISON:  No priors. FINDINGS: Lower chest: Atherosclerotic calcifications in the left circumflex and right coronary arteries. Calcifications of the mitral annulus and the aortic root. Mild scarring throughout the visualize lung bases. Hepatobiliary: No definite cystic or solid hepatic lesions identified on today's noncontrast CT examination. Tiny calcified granuloma in the right lobe of the liver. Numerous calcified gallstones lying dependently in the gallbladder. No findings to suggest an acute cholecystitis at this time. Pancreas: No discrete pancreatic mass confidently identified on  today's noncontrast CT examination. However, there are extensive inflammatory changes noted throughout the retroperitoneum adjacent to the spleen, concerning for an acute pancreatitis. No well-defined peripancreatic fluid collection is noted to strongly suggest a pancreatic pseudocyst at this time. Spleen: Unremarkable. Adrenals/Urinary Tract: Unenhanced appearance of the kidneys and bilateral adrenal glands is unremarkable. No hydroureteronephrosis. Urinary bladder is normal in appearance. Stomach/Bowel: Unenhanced appearance of the stomach is normal. There is no pathologic dilatation of small bowel or colon. Normal appendix. Rectum is moderately distended with well-formed stool, exerting mass effect upon adjacent structures. Vascular/Lymphatic: Aortic atherosclerosis, without definite aneurysm in the abdominal or pelvic vasculature. No definite lymphadenopathy is noted in the abdomen or pelvis. Reproductive: Uterus and ovaries are atrophic. Importantly, the vagina appears partially distended by what appears to be feculent contents, raising concern for potential rectovaginal fistula. Other: No significant volume of ascites.  No pneumoperitoneum. Musculoskeletal: There are no aggressive appearing lytic or blastic lesions noted in the visualized portions of the skeleton. IMPRESSION: 1. Imaging findings are concerning for an acute pancreatitis. 2. Cholelithiasis without evidence to suggest an acute cholecystitis at this time. 3. The vagina appears partially distended with what appears to be feculent contents. This raises concern for potential rectovaginal fistula. Clinical correlation is suggested. 4. Aortic atherosclerosis, in addition to at least 2 vessel coronary artery disease. Assessment for potential risk factor modification, dietary therapy or pharmacologic therapy may be warranted, if clinically indicated. 5. Additional incidental findings, as above. Electronically Signed   By: Vinnie Langton M.D.   On:  02/04/2016 15:00     Scheduled Meds: . atorvastatin  40 mg Oral q1800  . enoxaparin (LOVENOX) injection  30 mg Subcutaneous QHS  . insulin aspart  0-9 Units Subcutaneous TID WC  .  morphine injection  4 mg Intravenous Once  . potassium chloride  40 mEq Oral Q4H   Continuous Infusions: . dextrose 5 % and 0.45 % NaCl with KCl 20 mEq/L 150 mL/hr at 02/06/16 0957     LOS: 2 days    Time spent: 30 min    Janece Canterbury, MD [facility] Pager [phone]  If 7PM-7AM,  please contact night-coverage [email or web address] Password [email or web address] 02/06/2016, 1:17 PM

## 2016-02-06 NOTE — Evaluation (Signed)
Physical Therapy Evaluation Patient Details Name: Kristine Shaffer MRN: [id number] DOB: [birth year] Today's Date: 02/06/2016   History of Present Illness  Kristine Shaffer is a 75 y.o. female . The patient is a poor historian (dementia?) but nieces state she has been having abdominal pain for weeks now.    CT abdomen and pelvis-acute pancreatitis, cholelithiasis, vagina appears distended with possible fecal contents  Clinical Impression  The patyient is pleasant and not oriented. No family present to discuss caregivers availability. The patient ambualted with min guard assistance and no assistive device.Pt admitted with above diagnosis. Pt currently with functional limitations due to the deficits listed below (see PT Problem List). Pt will benefit from skilled PT to increase their independence and safety with mobility to allow discharge to the venue listed below.       Follow Up Recommendations Home health PT;Supervision/Assistance - 24 hour    Equipment Recommendations  None recommended by PT    Recommendations for Other Services       Precautions / Restrictions Precautions Precautions: Fall      Mobility  Bed Mobility               General bed mobility comments: in recliner  Transfers Overall transfer level: Needs assistance Equipment used: None;1 person hand held assist Transfers: Sit to/from Stand Sit to Stand: Supervision         General transfer comment: patient rises without assistance  Ambulation/Gait Ambulation/Gait assistance: Min guard Ambulation Distance (Feet): 250 Feet Assistive device: 1 person hand held assist Gait Pattern/deviations: Step-through pattern     General Gait Details: ambulates with not external assistance, just  guarding  Stairs            Wheelchair Mobility    Modified Rankin (Stroke Patients Only)       Balance Overall balance assessment: Needs assistance Sitting-balance support: Feet supported;No upper extremity  supported Sitting balance-Leahy Scale: Good     Standing balance support: During functional activity;No upper extremity supported Standing balance-Leahy Scale: Fair                               Pertinent Vitals/Pain Pain Assessment: Faces Faces Pain Scale: No hurt    Home Living Family/patient expects to be discharged to:: Private residence Living Arrangements: Other relatives Available Help at Discharge: Family Type of Home: Apartment Home Access: Stairs to enter         Additional Comments: patient is  unable to provide information reliably, no family present    Prior Function     Gait / Transfers Assistance Needed: patient reports independent, no family to confirm           Hand Dominance        Extremity/Trunk Assessment   Upper Extremity Assessment: Generalized weakness           Lower Extremity Assessment: Generalized weakness         Communication      Cognition Arousal/Alertness: Awake/alert Behavior During Therapy: WFL for tasks assessed/performed Overall Cognitive Status: No family/caregiver present to determine baseline cognitive functioning Area of Impairment: Orientation Orientation Level: Place;Time                  General Comments      Exercises     Assessment/Plan    PT Assessment Patient needs continued PT services  PT Problem List Decreased mobility;Decreased cognition;Decreased safety awareness  PT Treatment Interventions Gait training;Functional mobility training;Therapeutic activities;Therapeutic exercise;Patient/family education    PT Goals (Current goals can be found in the Care Plan section)  Acute Rehab PT Goals Patient Stated Goal: agreed to ambulating PT Goal Formulation: Patient unable to participate in goal setting Time For Goal Achievement: 02/20/16 Potential to Achieve Goals: Good    Frequency Min 3X/week   Barriers to discharge        Co-evaluation                End of Session   Activity Tolerance: Patient tolerated treatment well Patient left: in chair;with call bell/phone within reach;with chair alarm set Nurse Communication: Mobility status         Time: [id number] PT Time Calculation (min) (ACUTE ONLY): 14 min   Charges:   PT Evaluation $PT Eval Low Complexity: 1 Procedure     PT G CodesClaretha Shaffer 02/06/2016, 3:20 PM Kristine Shaffer PT [phone]

## 2016-02-06 NOTE — Progress Notes (Signed)
S: No acute events. Pain improved. Eating breakfast.   Vitals, labs, intake/output, and orders reviewed at this time. Lipase 774, downtrending. WBC 7.7. Bicarb 17, Cr 2.23, Chloride 120, K 2.9  Gen: A&Ox3, no distress  H&N: EOMI, atraumatic, neck supple Chest: unlabored respirations, RRR Abd: soft, minimally tender at epigastrium, nondistended Ext: warm, no edema Neuro: grossly normal  Lines/tubes/drains: PIV  A/P:  Gallstone pancreatitis, continuing to improve. Will follow for resolution- likely cholecystectomy prior to discharge. Hyperchloremia, hypokalemia- consider changing resuscitative fluids from NS to LR acute vs chronic renal insufficiency   Romana Juniper, MD [facility], [place] Pager [phone]

## 2016-02-07 LAB — CBC
HCT: 32.2 % — ABNORMAL LOW (ref 36.0–46.0)
HEMOGLOBIN: 11.1 g/dL — AB (ref 12.0–15.0)
MCH: 35 pg — ABNORMAL HIGH (ref 26.0–34.0)
MCHC: 34.5 g/dL (ref 30.0–36.0)
MCV: 101.6 fL — AB (ref 78.0–100.0)
PLATELETS: 172 10*3/uL (ref 150–400)
RBC: 3.17 MIL/uL — AB (ref 3.87–5.11)
RDW: 14.7 % (ref 11.5–15.5)
WBC: 7.4 10*3/uL (ref 4.0–10.5)

## 2016-02-07 LAB — COMPREHENSIVE METABOLIC PANEL
ALT: 12 U/L — AB (ref 14–54)
ANION GAP: 6 (ref 5–15)
AST: 27 U/L (ref 15–41)
Albumin: 2.8 g/dL — ABNORMAL LOW (ref 3.5–5.0)
Alkaline Phosphatase: 43 U/L (ref 38–126)
BUN: 15 mg/dL (ref 6–20)
CALCIUM: 7.5 mg/dL — AB (ref 8.9–10.3)
CHLORIDE: 118 mmol/L — AB (ref 101–111)
CO2: 16 mmol/L — AB (ref 22–32)
CREATININE: 2.16 mg/dL — AB (ref 0.44–1.00)
GFR, EST AFRICAN AMERICAN: 25 mL/min — AB (ref >60)
GFR, EST NON AFRICAN AMERICAN: 21 mL/min — AB (ref >60)
Glucose, Bld: 93 mg/dL (ref 65–99)
Potassium: 3.9 mmol/L (ref 3.5–5.1)
SODIUM: 140 mmol/L (ref 135–145)
Total Bilirubin: 0.8 mg/dL (ref 0.3–1.2)
Total Protein: 6 g/dL — ABNORMAL LOW (ref 6.5–8.1)

## 2016-02-07 LAB — GLUCOSE, CAPILLARY
GLUCOSE-CAPILLARY: 106 mg/dL — AB (ref 65–99)
GLUCOSE-CAPILLARY: 101 mg/dL — AB (ref 65–99)
GLUCOSE-CAPILLARY: 111 mg/dL — AB (ref 65–99)
Glucose-Capillary: 100 mg/dL — ABNORMAL HIGH (ref 65–99)

## 2016-02-07 LAB — HEPATITIS C ANTIBODY

## 2016-02-07 LAB — LIPASE, BLOOD: LIPASE: 289 U/L — AB (ref 11–51)

## 2016-02-07 MED ORDER — BOOST / RESOURCE BREEZE PO LIQD
1.0000 | Freq: Three times a day (TID) | ORAL | Status: DC
  Administered 2016-02-07 – 2016-02-12 (×7): 1 via ORAL

## 2016-02-07 MED ORDER — AMLODIPINE BESYLATE 5 MG PO TABS
5.0000 mg | ORAL_TABLET | Freq: Every day | ORAL | Status: DC
  Administered 2016-02-07 – 2016-02-12 (×4): 5 mg via ORAL
  Filled 2016-02-07 (×4): qty 1

## 2016-02-07 MED ORDER — LABETALOL HCL 100 MG PO TABS
100.0000 mg | ORAL_TABLET | Freq: Two times a day (BID) | ORAL | Status: DC
  Administered 2016-02-07 – 2016-02-12 (×8): 100 mg via ORAL
  Filled 2016-02-07 (×8): qty 1

## 2016-02-07 NOTE — Progress Notes (Signed)
PROGRESS NOTE  Sharane Deibel  [id number] DOB: 06-23-1940 DOA: 02/04/2016 PCP: No primary care provider on file.  Brief Narrative:   Kashauna Swanick is a 75 y.o. female with who has not seen a doctor in a long time and has no past medical history who presented with poor appetite for many years and intermittent abdominal pain for the last month.  On the day of admission, she developed nausea and nonbloody emesis.  In the ER, Lipase-5081, CT abdomen and pelvis demonstrated acute pancreatitis, cholelithiasis.  Incidentally, she was very hypertensive, her creatinine was elevated at 2.88 with unknown baseline, and her CT showed that her vagina appeared distended with possible fecal contents.    Assessment & Plan:   Principal Problem:   Acute pancreatitis Active Problems:   Renal failure   Lactic acidosis   Benign essential HTN   Acute pancreatitis, likely gallstone pacreatitis, improving - continue healthy heart diet - change to d5 1/2 NS IVF -  Lipase trending down and abdominal pain resolved -  Anticipate surgery prior to discharge  Acute kidney injury vs. Chronic kidney disease with nongap metabolic acidosis, creatinine trended down  -  Continue IVF -  No evidence of obstruction on CT -  Minimize nephrotoxins and renally dose medications  Essential hypertension, blood pressure moderately elevated.  Severe LVH on ECHO suggests longstanding hypertension -  Continue prn IV hydralazine  -  Add norvasc  ECG changes and coronary calcifications seen on CT. Diffuse ST segment depressions now resolved -  Troponin negative -  ECHO with grade 2 DD and severe LVH  -  Started statin, LDL 120  Hyperglycemia -  A1c pending  Lactic acidosis -  Resolved with IVF boluses  Leukocytosis, resolved  Tachycardia, resolved with IVF  Elevated D dimer, suspect due to acute pancreatitis- hold off on further work up  Clotee Schlicker term memory loss and probable dementia - B12 borderline low, RPR  NR -  vitain b12 injection  Possible stool in vagina on CT, but no odor and no stool reported by RN.    Hypokalemia - oral and IV potassium repletion  DVT prophylaxis:  lovenox Code Status:  full Family Communication:  Gwendolyn and second neice  Disposition Plan:  Likely cholecystectomy early this coming week.  Will need outpatient follow up with cardiology, nephrology.       Consultants:   General surgery  Procedures:  none  Antimicrobials:   none    Subjective: Denies chest pains, nausea, vomiting, and abdominal pain again today. Feeling well.    Objective: Vitals:   02/06/16 0503 02/06/16 1300 02/06/16 2108 02/07/16 0517  BP: 135/62 (!) 162/70 (!) 165/81 (!) 152/73  Pulse: 81 85 80 79  Resp: (!) 24 (!) 25 20 16   Temp: 98.3 F (36.8 C) 98 F (36.7 C) 98 F (36.7 C) 98.8 F (37.1 C)  TempSrc: Oral Oral Oral Oral  SpO2: 99% 100% 97% 100%  Weight:      Height:        Intake/Output Summary (Last 24 hours) at 02/07/16 1441 Last data filed at 02/07/16 0943  Gross per 24 hour  Intake           1877.5 ml  Output              200 ml  Net           1677.5 ml   Filed Weights   02/04/16 1907  Weight: 44.4 kg (97 lb 14.2 oz)  Examination:  General exam:  Adult female, thin.  No acute distress.  Sitting in chair.   HEENT:  NCAT, MMM Respiratory system: Clear to auscultation bilaterally Cardiovascular system: Regular rate and rhythm, normal S1/S2. No murmurs, rubs, gallops or clicks.  Warm extremities Gastrointestinal system: Normal active bowel sounds, soft, nondistended, nontender MSK:  Normal tone and bulk, no lower extremity edema Neuro:  Grossly intact    Data Reviewed: I have personally reviewed following labs and imaging studies  CBC:  Recent Labs Lab 02/04/16 1205 02/05/16 1625 02/06/16 0545 02/07/16 0538  WBC 13.5* 9.2 7.7 7.4  NEUTROABS 10.8*  --   --   --   HGB 15.6* 12.5 10.7* 11.1*  HCT 44.3 36.0 31.0* 32.2*  MCV 99.8 99.7 100.6*  101.6*  PLT 255 208 160 [id number]   Basic Metabolic Panel:  Recent Labs Lab 02/04/16 1205 02/05/16 0428 02/05/16 1625 02/06/16 0545 02/07/16 0538  NA 135 140 139 144 140  K 4.3 3.5 3.7 2.9* 3.9  CL 102 108 110 120* 118*  CO2 18* 18* 20* 17* 16*  GLUCOSE 177* 130* 157* 160* 93  BUN 28* 29* 29* 22* 15  CREATININE 2.88* 2.70* 2.65* 2.23* 2.16*  CALCIUM 9.1 9.2 8.3* 7.3* 7.5*   GFR: Estimated Creatinine Clearance: 15.8 mL/min (by C-G formula based on SCr of 2.16 mg/dL (H)). Liver Function Tests:  Recent Labs Lab 02/04/16 1205 02/05/16 1625 02/06/16 0545 02/07/16 0538  AST 23 32 21 27  ALT 8* 9* 10* 12*  ALKPHOS 53 46 41 43  BILITOT 1.0 1.0 0.7 0.8  PROT 7.8 6.7 5.5* 6.0*  ALBUMIN 3.8 3.2* 2.6* 2.8*    Recent Labs Lab 02/04/16 1205 02/05/16 0428 02/06/16 0545 02/07/16 0538  LIPASE 5,081* 1,679* 774* 289*   No results for input(s): AMMONIA in the last 168 hours. Coagulation Profile: No results for input(s): INR, PROTIME in the last 168 hours. Cardiac Enzymes:  Recent Labs Lab 02/05/16 1625  TROPONINI <0.03   BNP (last 3 results) No results for input(s): PROBNP in the last 8760 hours. HbA1C: No results for input(s): HGBA1C in the last 72 hours. CBG:  Recent Labs Lab 02/06/16 1142 02/06/16 1630 02/06/16 2103 02/07/16 0812 02/07/16 1135  GLUCAP 113* 79 112* 100* 106*   Lipid Profile:  Recent Labs  02/05/16 0428 02/06/16 0545  CHOL  --  179  HDL  --  38*  LDLCALC  --  120*  TRIG 158* 105  CHOLHDL  --  4.7   Thyroid Function Tests: No results for input(s): TSH, T4TOTAL, FREET4, T3FREE, THYROIDAB in the last 72 hours. Anemia Panel:  Recent Labs  02/05/16 0428  VITAMINB12 202   Urine analysis:    Component Value Date/Time   COLORURINE AMBER (A) 02/04/2016 1103   APPEARANCEUR TURBID (A) 02/04/2016 1103   LABSPEC 1.020 02/04/2016 1103   PHURINE 5.5 02/04/2016 1103   GLUCOSEU NEGATIVE 02/04/2016 1103   HGBUR MODERATE (A) 02/04/2016 1103     BILIRUBINUR LARGE (A) 02/04/2016 1103   KETONESUR 40 (A) 02/04/2016 1103   PROTEINUR 100 (A) 02/04/2016 1103   NITRITE NEGATIVE 02/04/2016 1103   LEUKOCYTESUR NEGATIVE 02/04/2016 1103   Sepsis Labs: @LABRCNTIP (procalcitonin:4,lacticidven:4)  ) Recent Results (from the past 240 hour(s))  Urine culture     Status: None   Collection Time: 02/04/16 11:03 AM  Result Value Ref Range Status   Specimen Description URINE, RANDOM  Final   Special Requests NONE  Final   Culture   Final  NO GROWTH 1 DAY Performed at [facility]    Report Status 02/05/2016 FINAL  Final  Blood culture (routine x 2)     Status: None (Preliminary result)   Collection Time: 02/04/16 11:25 AM  Result Value Ref Range Status   Specimen Description BLOOD RIGHT FOREARM  Final   Special Requests IN PEDIATRIC BOTTLE 3CC  Final   Culture   Final    NO GROWTH 3 DAYS Performed at [facility]    Report Status PENDING  Incomplete  Blood culture (routine x 2)     Status: None (Preliminary result)   Collection Time: 02/04/16 12:50 PM  Result Value Ref Range Status   Specimen Description BLOOD RIGHT HAND  Final   Special Requests IN PEDIATRIC BOTTLE [place]  Final   Culture   Final    NO GROWTH 3 DAYS Performed at [facility]    Report Status PENDING  Incomplete      Radiology Studies: No results found.   Scheduled Meds: . atorvastatin  40 mg Oral q1800  . enoxaparin (LOVENOX) injection  30 mg Subcutaneous QHS  . feeding supplement  1 Container Oral TID BM  . insulin aspart  0-9 Units Subcutaneous TID WC  .  morphine injection  4 mg Intravenous Once   Continuous Infusions: . dextrose 5 % and 0.45 % NaCl with KCl 20 mEq/L 150 mL/hr at 02/07/16 0124     LOS: 3 days    Time spent: 30 min    Janece Canterbury, MD [facility] Pager [phone]  If 7PM-7AM, please contact night-coverage [email or web address] Password [email or web address] 02/07/2016, 2:41 PM

## 2016-02-07 NOTE — Progress Notes (Signed)
Pt BP 186/86.   Dr. Sheran Fava aware.  Will administer amlodipine 5mg  per order and continue to monitor BP. Patient currently resting.

## 2016-02-07 NOTE — Progress Notes (Signed)
S: No acute events. Abdominal pain continues to improve.   Vitals, labs, intake/output, and orders reviewed at this time. Lipase pending.   Gen: A&Ox3, no distress  H&N: EOMI, atraumatic, neck supple Chest: unlabored respirations, RRR Abd: soft, minimally tender in epigastrium, nondistended Ext: warm, no edema Neuro: grossly normal  Lines/tubes/drains: PIV  A/P:  Gallstone pancreatitis, clinically doing well. Plan for lap chole this admission.    Romana Juniper, MD [facility], [place] Pager [phone]

## 2016-02-07 NOTE — Progress Notes (Signed)
Resumed care of patient, currently resting comfortably in bed. Orders reviewed. Agree with previous RN assessment. Will continue to monitor. Adah Salvage, RN

## 2016-02-07 NOTE — Progress Notes (Addendum)
Patient had one episode of emesis; a moderate amount of yellow bile.  Will administer zofran. Dr. Sheran Fava aware.

## 2016-02-07 NOTE — Progress Notes (Addendum)
Patient BP still 201/99.  Will administer hydralazine.  Dr. Sheran Fava aware.

## 2016-02-07 NOTE — Progress Notes (Signed)
Initial Nutrition Assessment  DOCUMENTATION CODES:   Non-severe (moderate) malnutrition in context of acute illness/injury  INTERVENTION:   Provide Boost Breeze po TID, each supplement provides 250 kcal and 9 grams of protein Encourage PO intake RD to continue to monitor  NUTRITION DIAGNOSIS:   Malnutrition related to acute illness as evidenced by percent weight loss, energy intake < 75% for > 7 days, mild depletion of body fat.  GOAL:   Patient will meet greater than or equal to 90% of their needs   MONITOR:   PO intake, Supplement acceptance, Labs, Weight trends, I & O's  REASON FOR ASSESSMENT:   Consult Assessment of nutrition requirement/status  ASSESSMENT:   75 y.o. female with who has not seen a doctor in a long time and has no past medical history who presented with poor appetite for many years and intermittent abdominal pain for the last month.  On the day of admission, she developed nausea and nonbloody emesis.  In the ER, Lipase-5081, CT abdomen and pelvis demonstrated acute pancreatitis, cholelithiasis.   Patient in room with no family at bedside. Pt unable to provide much history, noted history of dementia. Pt consuming 25-75% of HH diet. Lunch tray was sitting at bedside table. Pt states she did not want to eat yet. Pt reports poor appetite PTA, has improved since admission. She has been eating less recently d/t abdominal pain. Noted plan for lap chole this admission. Pt willing to try Boost Breeze supplements, RD to order.  No weight history provided in chart. Pt reports UBW of ~120 lb. This is a 23 lb weight loss over the last month (19% wt loss x 1 month ,significant for time frame). Nutrition-Focused physical exam completed. Findings are mild fat depletion, no muscle depletion, and no edema.   Medications: D5 and .45% NaCl w/ KCl infusion at 150 ml /hr- provides 612 kcal Labs reviewed: CBGs: 100-106  Diet Order:  Diet Heart Room service appropriate? Yes;  Fluid consistency: Thin  Skin:  Reviewed, no issues  Last BM:  10/20  Height:   Ht Readings from Last 1 Encounters:  02/04/16 5' (1.524 m)    Weight:   Wt Readings from Last 1 Encounters:  02/04/16 97 lb 14.2 oz (44.4 kg)    Ideal Body Weight:  45.5 kg  BMI:  Body mass index is 19.12 kg/m.  Estimated Nutritional Needs:   Kcal:  1200-1400  Protein:  55-65g  Fluid:  1.4L/day  EDUCATION NEEDS:   No education needs identified at this time  Clayton Bibles, MS, RD, LDN Pager: [phone] After Hours Pager: [phone]

## 2016-02-08 LAB — HEMOGLOBIN A1C
HEMOGLOBIN A1C: 4.4 % — AB (ref 4.8–5.6)
MEAN PLASMA GLUCOSE: 80 mg/dL

## 2016-02-08 LAB — COMPREHENSIVE METABOLIC PANEL
ALBUMIN: 3 g/dL — AB (ref 3.5–5.0)
ALT: 11 U/L — AB (ref 14–54)
AST: 22 U/L (ref 15–41)
Alkaline Phosphatase: 50 U/L (ref 38–126)
Anion gap: 6 (ref 5–15)
BUN: 13 mg/dL (ref 6–20)
CHLORIDE: 116 mmol/L — AB (ref 101–111)
CO2: 17 mmol/L — AB (ref 22–32)
CREATININE: 1.86 mg/dL — AB (ref 0.44–1.00)
Calcium: 7.8 mg/dL — ABNORMAL LOW (ref 8.9–10.3)
GFR calc Af Amer: 29 mL/min — ABNORMAL LOW (ref >60)
GFR calc non Af Amer: 25 mL/min — ABNORMAL LOW (ref >60)
Glucose, Bld: 114 mg/dL — ABNORMAL HIGH (ref 65–99)
Potassium: 4.2 mmol/L (ref 3.5–5.1)
SODIUM: 139 mmol/L (ref 135–145)
Total Bilirubin: 0.7 mg/dL (ref 0.3–1.2)
Total Protein: 6.4 g/dL — ABNORMAL LOW (ref 6.5–8.1)

## 2016-02-08 LAB — PROTEIN ELECTROPHORESIS, SERUM
A/G RATIO SPE: 1.1 (ref 0.7–1.7)
ALBUMIN ELP: 2.7 g/dL — AB (ref 2.9–4.4)
Alpha-1-Globulin: 0.3 g/dL (ref 0.0–0.4)
Alpha-2-Globulin: 0.6 g/dL (ref 0.4–1.0)
BETA GLOBULIN: 0.7 g/dL (ref 0.7–1.3)
GLOBULIN, TOTAL: 2.4 g/dL (ref 2.2–3.9)
Gamma Globulin: 0.8 g/dL (ref 0.4–1.8)
Total Protein ELP: 5.1 g/dL — ABNORMAL LOW (ref 6.0–8.5)

## 2016-02-08 LAB — CBC
HCT: 35.4 % — ABNORMAL LOW (ref 36.0–46.0)
Hemoglobin: 12.3 g/dL (ref 12.0–15.0)
MCH: 34.5 pg — AB (ref 26.0–34.0)
MCHC: 34.7 g/dL (ref 30.0–36.0)
MCV: 99.2 fL (ref 78.0–100.0)
PLATELETS: 190 10*3/uL (ref 150–400)
RBC: 3.57 MIL/uL — AB (ref 3.87–5.11)
RDW: 14.7 % (ref 11.5–15.5)
WBC: 6.5 10*3/uL (ref 4.0–10.5)

## 2016-02-08 LAB — GLUCOSE, CAPILLARY: Glucose-Capillary: 106 mg/dL — ABNORMAL HIGH (ref 65–99)

## 2016-02-08 NOTE — Progress Notes (Signed)
PROGRESS NOTE  Kristine Shaffer  [id number] DOB: 1940-06-19 DOA: 02/04/2016 PCP: No primary care provider on file.  Brief Narrative:   Kristine Shaffer is a 75 y.o. female with who has not seen a doctor in a long time and has no past medical history who presented with poor appetite for many years and intermittent abdominal pain for the last month.  On the day of admission, she developed nausea and nonbloody emesis.  In the ER, Lipase-[id number], CT abdomen and pelvis demonstrated acute pancreatitis, cholelithiasis.  Incidentally, she was very hypertensive, her creatinine was elevated at 2.88 with unknown baseline, and her CT showed that her vagina appeared distended with possible fecal contents.    Assessment & Plan:   Principal Problem:   Acute pancreatitis Active Problems:   Renal failure   Lactic acidosis   Benign essential HTN   Acute pancreatitis, likely gallstone pacreatitis, abdominal pain resolved but having intermittent bilious emesis - continue healthy heart diet - continue d5 1/2 NS IVF - Anticipate surgery possibly tomorrow (unable to reach family today)  Acute kidney injury vs. Chronic kidney disease with nongap metabolic acidosis, creatinine trended down  -  Continue IVF -  No evidence of obstruction on CT -  Minimize nephrotoxins and renally dose medications  Essential hypertension, blood pressure moderately elevated.  Severe LVH on ECHO suggests longstanding hypertension -  Continue prn IV hydralazine  -  Added norvasc and labetalol  ECG changes and coronary calcifications seen on CT. Diffuse ST segment depressions now resolved -  Troponin negative -  ECHO with grade 2 DD and severe LVH  -  Started statin, LDL 120  Danyon Mcginness term memory loss and probable dementia - B12 borderline low, RPR NR -  vitain b12 injection 10/22  Possible stool in vagina on CT, but no odor and no stool reported by RN.   -  No stool on external vaginal exam -  Patient reports this is a problem   -  Will need GYN referral at discharge  Hyperglycemia, likely due to stress, A1c 4.4.  D/c SSI Hypokalemia, resolved with oral and IV potassium repletion Lactic acidosis, resolved with IVF boluses Leukocytosis, resolved Tachycardia, resolved with IVF Elevated D dimer, suspect due to acute pancreatitis- hold off on further work up  DVT prophylaxis:  lovenox Code Status:  full Family Communication:  Gwendolyn and second neice  Disposition Plan:  Likely cholecystectomy early this coming week.  Will need outpatient follow up with cardiology, nephrology.       Consultants:   General surgery  Procedures:  none  Antimicrobials:   none    Subjective: Denies chest pains, nausea, vomiting, and abdominal pain again today. Frequent stools during the day and some stool does leak out through vagina she thinks.  Small amount of bilious emesis.  Objective: Vitals:   02/07/16 2029 02/08/16 0552 02/08/16 0955 02/08/16 1500  BP: (!) 152/75 (!) 150/76 (!) 142/71 139/70  Pulse: 96 90 74 86  Resp: 18 18  20   Temp: 98.6 F (37 C) 98.2 F (36.8 C)  98.5 F (36.9 C)  TempSrc: Oral Oral  Oral  SpO2: 100% 100%  100%  Weight:      Height:        Intake/Output Summary (Last 24 hours) at 02/08/16 1536 Last data filed at 02/08/16 [id number]  Gross per 24 hour  Intake          1774.17 ml  Output  275 ml  Net          1499.17 ml   Filed Weights   02/04/16 1907  Weight: 44.4 kg (97 lb 14.2 oz)    Examination:  General exam:  Adult female, thin.  No acute distress.  Sitting in chair.   HEENT:  NCAT, MMM Respiratory system: Clear to auscultation bilaterally Cardiovascular system: Regular rate and rhythm, normal S1/S2. No murmurs, rubs, gallops or clicks.  Warm extremities Gastrointestinal system: Normal active bowel sounds, soft, nondistended, nontender MSK:  Normal tone and bulk, no lower extremity edema Neuro:  Grossly intact GU:  Vagina:  Os is visible near the vaginal  introitus, no obvious mass or stool     Data Reviewed: I have personally reviewed following labs and imaging studies  CBC:  Recent Labs Lab 02/04/16 1205 02/05/16 1625 02/06/16 0545 02/07/16 0538 02/08/16 0428  WBC 13.5* 9.2 7.7 7.4 6.5  NEUTROABS 10.8*  --   --   --   --   HGB 15.6* 12.5 10.7* 11.1* 12.3  HCT 44.3 36.0 31.0* 32.2* 35.4*  MCV 99.8 99.7 100.6* 101.6* 99.2  PLT 255 208 160 172 [id number]   Basic Metabolic Panel:  Recent Labs Lab 02/05/16 0428 02/05/16 1625 02/06/16 0545 02/07/16 0538 02/08/16 0428  NA 140 139 144 140 139  K 3.5 3.7 2.9* 3.9 4.2  CL 108 110 120* 118* 116*  CO2 18* 20* 17* 16* 17*  GLUCOSE 130* 157* 160* 93 114*  BUN 29* 29* 22* 15 13  CREATININE 2.70* 2.65* 2.23* 2.16* 1.86*  CALCIUM 9.2 8.3* 7.3* 7.5* 7.8*   GFR: Estimated Creatinine Clearance: 18.3 mL/min (by C-G formula based on SCr of 1.86 mg/dL (H)). Liver Function Tests:  Recent Labs Lab 02/04/16 1205 02/05/16 1625 02/06/16 0545 02/07/16 0538 02/08/16 0428  AST 23 32 21 27 22   ALT 8* 9* 10* 12* 11*  ALKPHOS 53 46 41 43 50  BILITOT 1.0 1.0 0.7 0.8 0.7  PROT 7.8 6.7 5.5* 6.0* 6.4*  ALBUMIN 3.8 3.2* 2.6* 2.8* 3.0*    Recent Labs Lab 02/04/16 1205 02/05/16 0428 02/06/16 0545 02/07/16 0538  LIPASE 5,081* 1,679* 774* 289*   No results for input(s): AMMONIA in the last 168 hours. Coagulation Profile: No results for input(s): INR, PROTIME in the last 168 hours. Cardiac Enzymes:  Recent Labs Lab 02/05/16 1625  TROPONINI <0.03   BNP (last 3 results) No results for input(s): PROBNP in the last 8760 hours. HbA1C:  Recent Labs  02/06/16 0545  HGBA1C 4.4*   CBG:  Recent Labs Lab 02/07/16 0812 02/07/16 1135 02/07/16 1809 02/07/16 2135 02/08/16 0752  GLUCAP 100* 106* 101* 111* 106*   Lipid Profile:  Recent Labs  02/06/16 0545  CHOL 179  HDL 38*  LDLCALC 120*  TRIG 105  CHOLHDL 4.7   Thyroid Function Tests: No results for input(s): TSH, T4TOTAL,  FREET4, T3FREE, THYROIDAB in the last 72 hours. Anemia Panel: No results for input(s): VITAMINB12, FOLATE, FERRITIN, TIBC, IRON, RETICCTPCT in the last 72 hours. Urine analysis:    Component Value Date/Time   COLORURINE AMBER (A) 02/04/2016 1103   APPEARANCEUR TURBID (A) 02/04/2016 1103   LABSPEC 1.020 02/04/2016 1103   PHURINE 5.5 02/04/2016 1103   GLUCOSEU NEGATIVE 02/04/2016 1103   HGBUR MODERATE (A) 02/04/2016 1103   BILIRUBINUR LARGE (A) 02/04/2016 1103   KETONESUR 40 (A) 02/04/2016 1103   PROTEINUR 100 (A) 02/04/2016 1103   NITRITE NEGATIVE 02/04/2016 1103   LEUKOCYTESUR NEGATIVE 02/04/2016 1103  Sepsis Labs: @LABRCNTIP (procalcitonin:4,lacticidven:4)  ) Recent Results (from the past 240 hour(s))  Urine culture     Status: None   Collection Time: 02/04/16 11:03 AM  Result Value Ref Range Status   Specimen Description URINE, RANDOM  Final   Special Requests NONE  Final   Culture   Final    NO GROWTH 1 DAY Performed at [facility]    Report Status 02/05/2016 FINAL  Final  Blood culture (routine x 2)     Status: None (Preliminary result)   Collection Time: 02/04/16 11:25 AM  Result Value Ref Range Status   Specimen Description BLOOD RIGHT FOREARM  Final   Special Requests IN PEDIATRIC BOTTLE 3CC  Final   Culture   Final    NO GROWTH 4 DAYS Performed at [facility]    Report Status PENDING  Incomplete  Blood culture (routine x 2)     Status: None (Preliminary result)   Collection Time: 02/04/16 12:50 PM  Result Value Ref Range Status   Specimen Description BLOOD RIGHT HAND  Final   Special Requests IN PEDIATRIC BOTTLE 2CC  Final   Culture   Final    NO GROWTH 4 DAYS Performed at [facility]    Report Status PENDING  Incomplete      Radiology Studies: No results found.   Scheduled Meds: . amLODipine  5 mg Oral Daily  . atorvastatin  40 mg Oral q1800  . enoxaparin (LOVENOX) injection  30 mg Subcutaneous QHS  . feeding supplement   1 Container Oral TID BM  . labetalol  100 mg Oral BID  .  morphine injection  4 mg Intravenous Once   Continuous Infusions: . dextrose 5 % and 0.45 % NaCl with KCl 20 mEq/L 100 mL/hr (02/08/16 0954)     LOS: 4 days    Time spent: 30 min    Janece Canterbury, MD [facility] Pager [phone]  If 7PM-7AM, please contact night-coverage [email or web address] Password [facility] 02/08/2016, 3:36 PM

## 2016-02-08 NOTE — Care Management Important Message (Signed)
Important Message  Patient Details  Name: Malia Bem MRN: [id number] Date of Birth: 09-Feb-1941   Medicare Important Message Given:  Yes    Camillo Flaming 02/08/2016, 10:16 AMImportant Message  Patient Details  Name: Germaine Sitzes MRN: [id number] Date of Birth: 08-24-1940   Medicare Important Message Given:  Yes    Camillo Flaming 02/08/2016, 10:16 AM

## 2016-02-08 NOTE — Progress Notes (Signed)
Abdominal pain resolved, Cr continues to improve.  Spoke to patient about recommendation of gallbladder removal to prevent recurrence of pancreatitis. She showed understanding but would like to wait until she speaks to family this morning. -Will hold NPO for possible surgery this afternoon  Gurney Maxin, M.D. [facility] Pg: [phone]

## 2016-02-08 NOTE — Progress Notes (Signed)
Patient unable to reach family, she would like to wait and talk to them before proceeding with surgery. -will restart diet with plan for NPO after midnight

## 2016-02-08 NOTE — Progress Notes (Signed)
Physical Therapy Treatment Patient Details Name: Auriella Wieand MRN: [id number] DOB: January 06, 1941 Today's Date: 02/08/2016    History of Present Illness Liticia Gasior is a 75 y.o. female . The patient is a poor historian (dementia?) but nieces state she has been having abdominal pain for weeks now.    CT abdomen and pelvis-acute pancreatitis, cholelithiasis, vagina appears distended with possible fecal contents    PT Comments    Pt is independent with bed mobility, transfers, and ambulating 400' without an assistive device. No further PT indicated, PT signing off. Supervision at home recommended for safety due to memory issues (? Dementia).   Follow Up Recommendations  Supervision/Assistance - 24 hour;No PT follow up     Equipment Recommendations  None recommended by PT    Recommendations for Other Services       Precautions / Restrictions Precautions Precautions: Fall Restrictions Weight Bearing Restrictions: No    Mobility  Bed Mobility Overal bed mobility: Independent                Transfers Overall transfer level: Needs assistance Equipment used: None Transfers: Sit to/from Stand Sit to Stand: Independent         General transfer comment: patient rises without assistance  Ambulation/Gait Ambulation/Gait assistance: Independent Ambulation Distance (Feet): 400 Feet Assistive device: None Gait Pattern/deviations: WFL(Within Functional Limits)   Gait velocity interpretation: at or above normal speed for age/gender General Gait Details: ambulates with no external assistance, no LOB   Stairs            Wheelchair Mobility    Modified Rankin (Stroke Patients Only)       Balance     Sitting balance-Leahy Scale: Normal       Standing balance-Leahy Scale: Good                      Cognition Arousal/Alertness: Awake/alert Behavior During Therapy: WFL for tasks assessed/performed Overall Cognitive Status: No family/caregiver present  to determine baseline cognitive functioning Area of Impairment: Orientation Orientation Level: Place;Time                  Exercises      General Comments        Pertinent Vitals/Pain Pain Assessment: No/denies pain Faces Pain Scale: No hurt    Home Living                      Prior Function            PT Goals (current goals can now be found in the care plan section) Acute Rehab PT Goals Patient Stated Goal: agreed to ambulating PT Goal Formulation: Patient unable to participate in goal setting Time For Goal Achievement: 02/20/16 Potential to Achieve Goals: Good Progress towards PT goals: Goals met/education completed, patient discharged from PT    Frequency    Min 3X/week      PT Plan      Co-evaluation             End of Session Equipment Utilized During Treatment: Gait belt Activity Tolerance: Patient tolerated treatment well Patient left: in chair;with call bell/phone within reach;with chair alarm set     Time: 5329-9242 PT Time Calculation (min) (ACUTE ONLY): 17 min  Charges:  $Gait Training: 8-22 mins                    G Codes:      Philomena Doheny 02/08/2016, 9:55 AM  [phone]  

## 2016-02-09 ENCOUNTER — Inpatient Hospital Stay (HOSPITAL_COMMUNITY): Payer: Medicare Other | Admitting: Anesthesiology

## 2016-02-09 ENCOUNTER — Inpatient Hospital Stay (HOSPITAL_COMMUNITY): Payer: Medicare Other

## 2016-02-09 ENCOUNTER — Encounter (HOSPITAL_COMMUNITY)
Admission: EM | Disposition: A | Discharge status: Home / Self Care | Payer: Self-pay | Source: Home / Self Care | Attending: Internal Medicine

## 2016-02-09 ENCOUNTER — Encounter (HOSPITAL_COMMUNITY): Payer: Self-pay | Admitting: General Surgery

## 2016-02-09 DIAGNOSIS — N170 Acute kidney failure with tubular necrosis: Secondary | ICD-10-CM

## 2016-02-09 HISTORY — PX: CHOLECYSTECTOMY: SHX55

## 2016-02-09 LAB — CBC
HEMATOCRIT: 34.8 % — AB (ref 36.0–46.0)
HEMOGLOBIN: 11.8 g/dL — AB (ref 12.0–15.0)
MCH: 34.6 pg — ABNORMAL HIGH (ref 26.0–34.0)
MCHC: 33.9 g/dL (ref 30.0–36.0)
MCV: 102.1 fL — ABNORMAL HIGH (ref 78.0–100.0)
Platelets: 188 10*3/uL (ref 150–400)
RBC: 3.41 MIL/uL — ABNORMAL LOW (ref 3.87–5.11)
RDW: 14.9 % (ref 11.5–15.5)
WBC: 8.2 10*3/uL (ref 4.0–10.5)

## 2016-02-09 LAB — COMPREHENSIVE METABOLIC PANEL
ALBUMIN: 2.7 g/dL — AB (ref 3.5–5.0)
ALT: 10 U/L — ABNORMAL LOW (ref 14–54)
ANION GAP: 7 (ref 5–15)
AST: 17 U/L (ref 15–41)
Alkaline Phosphatase: 45 U/L (ref 38–126)
BILIRUBIN TOTAL: 1 mg/dL (ref 0.3–1.2)
BUN: 14 mg/dL (ref 6–20)
CHLORIDE: 117 mmol/L — AB (ref 101–111)
CO2: 15 mmol/L — ABNORMAL LOW (ref 22–32)
Calcium: 7.4 mg/dL — ABNORMAL LOW (ref 8.9–10.3)
Creatinine, Ser: 1.72 mg/dL — ABNORMAL HIGH (ref 0.44–1.00)
GFR calc Af Amer: 32 mL/min — ABNORMAL LOW (ref >60)
GFR, EST NON AFRICAN AMERICAN: 28 mL/min — AB (ref >60)
GLUCOSE: 124 mg/dL — AB (ref 65–99)
POTASSIUM: 4 mmol/L (ref 3.5–5.1)
Sodium: 139 mmol/L (ref 135–145)
TOTAL PROTEIN: 5.9 g/dL — AB (ref 6.5–8.1)

## 2016-02-09 LAB — CULTURE, BLOOD (ROUTINE X 2)
Culture: NO GROWTH
Culture: NO GROWTH

## 2016-02-09 SURGERY — LAPAROSCOPIC CHOLECYSTECTOMY WITH INTRAOPERATIVE CHOLANGIOGRAM
Anesthesia: General | Site: Abdomen

## 2016-02-09 MED ORDER — SUCCINYLCHOLINE CHLORIDE 200 MG/10ML IV SOSY
PREFILLED_SYRINGE | INTRAVENOUS | Status: DC | PRN
  Administered 2016-02-09: 80 mg via INTRAVENOUS

## 2016-02-09 MED ORDER — BUPIVACAINE HCL 0.5 % IJ SOLN
INTRAMUSCULAR | Status: DC | PRN
  Administered 2016-02-09: 29 mL

## 2016-02-09 MED ORDER — VITAMINS A & D EX OINT
TOPICAL_OINTMENT | CUTANEOUS | Status: AC
  Filled 2016-02-09: qty 5

## 2016-02-09 MED ORDER — FENTANYL CITRATE (PF) 100 MCG/2ML IJ SOLN
INTRAMUSCULAR | Status: AC
  Filled 2016-02-09: qty 2

## 2016-02-09 MED ORDER — BUPIVACAINE HCL (PF) 0.5 % IJ SOLN
INTRAMUSCULAR | Status: AC
  Filled 2016-02-09: qty 30

## 2016-02-09 MED ORDER — SUGAMMADEX SODIUM 200 MG/2ML IV SOLN
INTRAVENOUS | Status: AC
  Filled 2016-02-09: qty 2

## 2016-02-09 MED ORDER — ONDANSETRON HCL 4 MG/2ML IJ SOLN
INTRAMUSCULAR | Status: DC | PRN
  Administered 2016-02-09: 4 mg via INTRAVENOUS

## 2016-02-09 MED ORDER — PROMETHAZINE HCL 25 MG/ML IJ SOLN: 6.2500 mg | INTRAMUSCULAR | Status: DC | PRN

## 2016-02-09 MED ORDER — DEXAMETHASONE SODIUM PHOSPHATE 10 MG/ML IJ SOLN
INTRAMUSCULAR | Status: AC
  Filled 2016-02-09: qty 1

## 2016-02-09 MED ORDER — SUGAMMADEX SODIUM 200 MG/2ML IV SOLN
INTRAVENOUS | Status: DC | PRN
  Administered 2016-02-09: 100 mg via INTRAVENOUS

## 2016-02-09 MED ORDER — ROCURONIUM BROMIDE 50 MG/5ML IV SOSY
PREFILLED_SYRINGE | INTRAVENOUS | Status: DC | PRN
  Administered 2016-02-09: 20 mg via INTRAVENOUS

## 2016-02-09 MED ORDER — DEXTROSE 5 % IV SOLN
2.0000 g | Freq: Once | INTRAVENOUS | Status: AC
  Administered 2016-02-09: 2 g via INTRAVENOUS
  Filled 2016-02-09: qty 2

## 2016-02-09 MED ORDER — HYDROCODONE-ACETAMINOPHEN 5-325 MG PO TABS: 1.0000 | ORAL_TABLET | ORAL | Status: DC | PRN

## 2016-02-09 MED ORDER — LACTATED RINGERS IV SOLN
INTRAVENOUS | Status: DC | PRN
  Administered 2016-02-09: 10:00:00 via INTRAVENOUS

## 2016-02-09 MED ORDER — IOPAMIDOL (ISOVUE-300) INJECTION 61%
INTRAVENOUS | Status: AC
  Filled 2016-02-09: qty 50

## 2016-02-09 MED ORDER — PROPOFOL 10 MG/ML IV BOLUS
INTRAVENOUS | Status: DC | PRN
  Administered 2016-02-09: 100 mg via INTRAVENOUS

## 2016-02-09 MED ORDER — FENTANYL CITRATE (PF) 100 MCG/2ML IJ SOLN
25.0000 ug | INTRAMUSCULAR | Status: DC | PRN
  Administered 2016-02-09 (×2): 50 ug via INTRAVENOUS

## 2016-02-09 MED ORDER — IOPAMIDOL (ISOVUE-300) INJECTION 61%
INTRAVENOUS | Status: DC | PRN
  Administered 2016-02-09: 10 mL

## 2016-02-09 MED ORDER — CEFOTETAN DISODIUM-DEXTROSE 2-2.08 GM-% IV SOLR
INTRAVENOUS | Status: AC
  Filled 2016-02-09: qty 50

## 2016-02-09 MED ORDER — ONDANSETRON HCL 4 MG/2ML IJ SOLN
INTRAMUSCULAR | Status: AC
  Filled 2016-02-09: qty 2

## 2016-02-09 MED ORDER — FENTANYL CITRATE (PF) 100 MCG/2ML IJ SOLN
INTRAMUSCULAR | Status: DC | PRN
  Administered 2016-02-09 (×4): 25 ug via INTRAVENOUS

## 2016-02-09 MED ORDER — 0.9 % SODIUM CHLORIDE (POUR BTL) OPTIME
TOPICAL | Status: DC | PRN
  Administered 2016-02-09: 1000 mL

## 2016-02-09 MED ORDER — LACTATED RINGERS IR SOLN
Status: DC | PRN
  Administered 2016-02-09: 1000 mL

## 2016-02-09 MED ORDER — ROCURONIUM BROMIDE 50 MG/5ML IV SOSY
PREFILLED_SYRINGE | INTRAVENOUS | Status: AC
  Filled 2016-02-09: qty 5

## 2016-02-09 MED ORDER — SUCCINYLCHOLINE CHLORIDE 20 MG/ML IJ SOLN
INTRAMUSCULAR | Status: AC
  Filled 2016-02-09: qty 1

## 2016-02-09 MED ORDER — DEXAMETHASONE SODIUM PHOSPHATE 10 MG/ML IJ SOLN
INTRAMUSCULAR | Status: DC | PRN
  Administered 2016-02-09: 10 mg via INTRAVENOUS

## 2016-02-09 MED ORDER — LIDOCAINE 2% (20 MG/ML) 5 ML SYRINGE
INTRAMUSCULAR | Status: AC
Start: 2016-02-09 — End: 2016-02-09
  Filled 2016-02-09: qty 5

## 2016-02-09 MED ORDER — LIDOCAINE 2% (20 MG/ML) 5 ML SYRINGE
INTRAMUSCULAR | Status: DC | PRN
  Administered 2016-02-09: 40 mg via INTRAVENOUS

## 2016-02-09 SURGICAL SUPPLY — 39 items
APPLIER CLIP ROT 10 11.4 M/L (STAPLE)
CABLE HIGH FREQUENCY MONO STRZ (ELECTRODE) ×2 IMPLANT
CATH CHOLANG 76X19 KUMAR (CATHETERS) ×2 IMPLANT
CHLORAPREP W/TINT 26ML (MISCELLANEOUS) ×2 IMPLANT
CLIP APPLIE ROT 10 11.4 M/L (STAPLE) IMPLANT
CLIP LIGATING HEM O LOK PURPLE (MISCELLANEOUS) IMPLANT
CLIP LIGATING HEMO LOK XL GOLD (MISCELLANEOUS) IMPLANT
COVER MAYO STAND STRL (DRAPES) ×2 IMPLANT
COVER SURGICAL LIGHT HANDLE (MISCELLANEOUS) ×2 IMPLANT
DECANTER SPIKE VIAL GLASS SM (MISCELLANEOUS) ×2 IMPLANT
DERMABOND ADVANCED (GAUZE/BANDAGES/DRESSINGS) ×1
DERMABOND ADVANCED .7 DNX12 (GAUZE/BANDAGES/DRESSINGS) ×1 IMPLANT
DRAIN CHANNEL 19F RND (DRAIN) IMPLANT
DRAPE C-ARM 42X120 X-RAY (DRAPES) ×2 IMPLANT
EVACUATOR SILICONE 100CC (DRAIN) IMPLANT
GLOVE BIOGEL PI IND STRL 7.0 (GLOVE) ×1 IMPLANT
GLOVE BIOGEL PI INDICATOR 7.0 (GLOVE) ×1
GLOVE SURG SS PI 7.0 STRL IVOR (GLOVE) ×2 IMPLANT
GOWN STRL REUS W/TWL LRG LVL3 (GOWN DISPOSABLE) ×2 IMPLANT
GOWN STRL REUS W/TWL XL LVL3 (GOWN DISPOSABLE) ×4 IMPLANT
GRASPER SUT TROCAR 14GX15 (MISCELLANEOUS) ×2 IMPLANT
IRRIG SUCT STRYKERFLOW 2 WTIP (MISCELLANEOUS) ×2
IRRIGATION SUCT STRKRFLW 2 WTP (MISCELLANEOUS) ×1 IMPLANT
KIT BASIN OR (CUSTOM PROCEDURE TRAY) ×2 IMPLANT
POUCH RETRIEVAL ECOSAC 10 (ENDOMECHANICALS) ×1 IMPLANT
POUCH RETRIEVAL ECOSAC 10MM (ENDOMECHANICALS) ×1
SCISSORS LAP 5X35 DISP (ENDOMECHANICALS) ×2 IMPLANT
SHEARS HARMONIC ACE PLUS 36CM (ENDOMECHANICALS) IMPLANT
SLEEVE XCEL OPT CAN 5 100 (ENDOMECHANICALS) ×4 IMPLANT
STOPCOCK 4 WAY LG BORE MALE ST (IV SETS) ×2 IMPLANT
SUT ETHILON 2 0 PS N (SUTURE) IMPLANT
SUT MNCRL AB 4-0 PS2 18 (SUTURE) ×2 IMPLANT
SUT VICRYL 0 ENDOLOOP (SUTURE) IMPLANT
TOWEL OR 17X26 10 PK STRL BLUE (TOWEL DISPOSABLE) ×2 IMPLANT
TOWEL OR NON WOVEN STRL DISP B (DISPOSABLE) IMPLANT
TRAY LAPAROSCOPIC (CUSTOM PROCEDURE TRAY) ×2 IMPLANT
TROCAR BLADELESS OPT 5 100 (ENDOMECHANICALS) ×2 IMPLANT
TROCAR XCEL 12X100 BLDLESS (ENDOMECHANICALS) ×2 IMPLANT
TUBING INSUF HEATED (TUBING) ×2 IMPLANT

## 2016-02-09 NOTE — Progress Notes (Addendum)
Pt plan to discharge home in am, will continue to follow for discharge needs.

## 2016-02-09 NOTE — Anesthesia Procedure Notes (Addendum)
Procedure Name: Intubation Date/Time: 02/09/2016 10:22 AM Performed by: Dione Booze Pre-anesthesia Checklist: Patient identified, Emergency Drugs available, Suction available and Patient being monitored Patient Re-evaluated:Patient Re-evaluated prior to inductionOxygen Delivery Method: Circle system utilized Preoxygenation: Pre-oxygenation with 100% oxygen Intubation Type: IV induction and Cricoid Pressure applied Laryngoscope Size: Mac and 4 Grade View: Grade I Tube type: Oral Tube size: 7.5 mm Number of attempts: 1 Airway Equipment and Method: Stylet Placement Confirmation: ETT inserted through vocal cords under direct vision,  positive ETCO2 and breath sounds checked- equal and bilateral Secured at: 21 cm Tube secured with: Tape Dental Injury: Teeth and Oropharynx as per pre-operative assessment

## 2016-02-09 NOTE — Discharge Instructions (Signed)

## 2016-02-09 NOTE — Op Note (Signed)
PATIENT:  Kristine Shaffer  75 y.o. female  PRE-OPERATIVE DIAGNOSIS:  CHOLECYSTITIS  POST-OPERATIVE DIAGNOSIS:  Gallbladder Pancreatitis  PROCEDURE:  Procedure(s): LAPAROSCOPIC CHOLECYSTECTOMY WITH INTRAOPERATIVE CHOLANGIOGRAM   SURGEON:  Surgeon(s): Mickeal Skinner, MD  ASSISTANT: none  ANESTHESIA:   local and general  Indications for procedure: Virgie Hubel is a 75 y.o. female with symptoms of RUQ pain consistent with gallstone pancreatitis, Confirmed by Ultrasound.  Description of procedure: The patient was brought into the operative suite, placed supine. Anesthesia was administered with endotracheal tube. Patient was strapped in place and foot board was secured. All pressure points were offloaded by foam padding. The patient was prepped and draped in the usual sterile fashion.  A small incision was made to the right of the umbilicus. A 81mm trocar was inserted into the peritoneal cavity with optical entry. Pneumoperitoneum was applied with high flow low pressure. 2 57mm trocars were placed in the RUQ. A 10mm trocar was placed in the subxiphoid space. All trocars sites were first anesthesized with 0.25% marcaine with epinephrine in the subcutaneous and preperitoneal layers. Next the patient was placed in reverse trendelenberg. The gallbladder was white with some filmy adhesions of the omentum, which were taken down with cautery.  The gallbladder was retracted cephalad and lateral. The peritoneum was reflected off the infundibulum working lateral to medial. The cystic duct and cystic artery were identified and further dissection revealed a critical view, due to concern for choledocholithiasis a cholangiogram was performed with [place]. Initially there was concern for filling defect and the duct was irrigated with 20cc of fluid, there was a questionable filling defect on second attempt but no obstruction of the duct. The cystic duct and cystic artery were doubly clipped and ligated.    The gallbladder was removed off the liver bed with cautery. The Gallbladder was placed in a specimen bag. The gallbladder fossa was irrigated and hemostasis was applied with cautery. The gallbladder was removed via the 21mm trocar. No dilation was required for removal, therefore no fascial closure was performed. Pneumoperitoneum was removed, all trocar were removed. All incisions were closed with 4-0 monocryl subcuticular stitch. The patient woke from anesthesia and was brought to PACU in stable condition. All counts were correct  Findings: normal ductal anatomy  Specimen: gallbladder  Blood loss: Total I/O In: 600 [I.V.:600] Out: 50 [Blood:50] ml  Local anesthesia: 29 ml 0.25% marcaine with epinephrine  Complications: none  PLAN OF CARE: Admit to inpatient   PATIENT DISPOSITION:  PACU - hemodynamically stable.  Gurney Maxin, M.D. General, Bariatric, & Minimally Invasive Surgery [facility]

## 2016-02-09 NOTE — Transfer of Care (Signed)
Immediate Anesthesia Transfer of Care Note  Patient: Kristine Shaffer  Procedure(s) Performed: Procedure(s): LAPAROSCOPIC CHOLECYSTECTOMY WITH INTRAOPERATIVE CHOLANGIOGRAM (N/A)  Patient Location: PACU  Anesthesia Type:General  Level of Consciousness: awake and patient cooperative  Airway & Oxygen Therapy: Patient Spontanous Breathing and Patient connected to face mask oxygen  Post-op Assessment: Report given to RN and Post -op Vital signs reviewed and stable  Post vital signs: Reviewed and stable  Last Vitals:  Vitals:   02/08/16 2000 02/09/16 0537  BP: 132/69 123/65  Pulse: 89 84  Resp: 20 20  Temp: 36.9 C 36.8 C    Last Pain:  Vitals:   02/09/16 0537  TempSrc: Oral  PainSc:       Patients Stated Pain Goal: 4 ([id number])  Complications: No apparent anesthesia complications

## 2016-02-09 NOTE — Anesthesia Procedure Notes (Signed)
Performed by: Shantanique Hodo       

## 2016-02-09 NOTE — Anesthesia Preprocedure Evaluation (Addendum)
Anesthesia Evaluation  Patient identified by MRN, date of birth, ID band Patient awake    Reviewed: Allergy & Precautions, NPO status , Patient's Chart, lab work & pertinent test results  Airway Mallampati: II  TM Distance: >3 FB Neck ROM: Full    Dental no notable dental hx.    Pulmonary neg pulmonary ROS,    Pulmonary exam normal breath sounds clear to auscultation       Cardiovascular hypertension, Pt. on medications Normal cardiovascular exam Rhythm:Regular Rate:Normal  ECHO 02-06-16: Impressions:  - LVEF 60-65%, severe LVH, normal wall motion, grade 1 DD with   indeterminate LV filling pressure, aortic sclerosis, MAC with   trivial MR, moderate LAE, mild RVE with reduced RVEF, mild TR,   RVSP 30 mmHg, normal IVC.   Neuro/Psych negative neurological ROS  negative psych ROS   GI/Hepatic negative GI ROS, Neg liver ROS, pancreatitis   Endo/Other  negative endocrine ROS  Renal/GU Renal InsufficiencyRenal disease  negative genitourinary   Musculoskeletal negative musculoskeletal ROS (+)   Abdominal   Peds negative pediatric ROS (+)  Hematology negative hematology ROS (+)   Anesthesia Other Findings   Reproductive/Obstetrics negative OB ROS                            Anesthesia Physical Anesthesia Plan  ASA: III  Anesthesia Plan: General   Post-op Pain Management:    Induction: Intravenous  Airway Management Planned: Oral ETT  Additional Equipment:   Intra-op Plan:   Post-operative Plan: Extubation in OR  Informed Consent: I have reviewed the patients History and Physical, chart, labs and discussed the procedure including the risks, benefits and alternatives for the proposed anesthesia with the patient or authorized representative who has indicated his/her understanding and acceptance.   Dental advisory given  Plan Discussed with: CRNA  Anesthesia Plan Comments:          Anesthesia Quick Evaluation

## 2016-02-09 NOTE — Progress Notes (Signed)
Spoke with patient about details of surgery again. Family agreed yesterday that patient should proceed with surgery. Attempted to reach patient's niece by phone without success. Will plan to proceed with lap chole w ioc today

## 2016-02-09 NOTE — Progress Notes (Signed)
PROGRESS NOTE  Kristine Shaffer  [id number] DOB: 04-Apr-1941 DOA: 02/04/2016 PCP: No primary care provider on file.  Brief Narrative:   Kristine Shaffer is a 75 y.o. female with who has not seen a doctor in a long time and has no past medical history who presented with poor appetite for many years and intermittent abdominal pain for the last month.  On the day of admission, she developed nausea and nonbloody emesis.  In the ER, Lipase-[id number], CT abdomen and pelvis demonstrated acute pancreatitis, cholelithiasis.  Incidentally, she was very hypertensive, her creatinine was elevated at 2.88 with unknown baseline, and her CT showed that her vagina appeared distended with possible fecal contents.  She was admitted and placed on IV fluids. Her lactic acid resolved. Her abdominal pain gradually improved and her lipase trended down. Her creatinine also improved. She underwent microscopic cholecystectomy on 10/24. She had what appeared to be some small nonobstructive gallstones in the nondilated common bile duct on intraoperative cholangiogram. Plan to repeat LFTs in the morning. If stable, and patient asymptomatic, will anticipate discharge on Wednesday.  Assessment & Plan:   Principal Problem:   Acute pancreatitis Active Problems:   Renal failure   Lactic acidosis   Benign essential HTN   Acute pancreatitis, likely gallstone pacreatitis, abdominal pain resolved but having intermittent bilious emesis s/p cholecystectomy on 10/24.  Intraoperative cholangiogram demonstrated some luminal irregularities in the nondilated common bile duct suggestive of some retained stones. Patient has been asymptomatic over the last few days and her LFTs have been normal. Spoke with general surgery who recommended repeating LFTs in the morning and if they remain stable and the patient asymptomatic, no further workup. - diet resumed post-operatively - continue d5 1/2 NS IVF until tolerating PO - LFTs in AM  Acute kidney injury  vs. Chronic kidney disease with nongap metabolic acidosis, creatinine trended down.  Anticipate acidosis will improve gradually over time.   -  No evidence of obstruction on CT -  Minimize nephrotoxins and renally dose medications -  Nephrology follow-up  Essential hypertension, blood pressure moderately elevated.  Severe LVH on ECHO suggests longstanding hypertension -  Continue prn IV hydralazine  -  Continue norvasc and labetalol  ECG changes and coronary calcifications seen on CT. Diffuse ST segment depressions now resolved -  Troponin negative -  ECHO with grade 2 DD and severe LVH  -  Started statin, LDL 120  Marguis Mathieson term memory loss and probable dementia - B12 borderline low, RPR NR -  vitain b12 injection 10/22  Possible stool in vagina on CT, but no odor and no stool reported by RN.  No stool visible on my external vaginal exam performed on 10/23. -  Patient confirms this is a problem  -  GYN referral at discharge  Hyperglycemia, likely due to stress, A1c 4.4.  D/c SSI Hypokalemia, resolved with oral and IV potassium repletion Lactic acidosis, resolved with IVF boluses Leukocytosis, resolved Tachycardia, resolved with IVF Elevated D dimer, suspect due to acute pancreatitis- hold off on further work up  DVT prophylaxis:  lovenox Code Status:  full Family Communication:  Gwendolyn and second neice  Disposition Plan:  Likely cholecystectomy early this coming week.  Will need outpatient follow up with cardiology, nephrology.       Consultants:   General surgery  Procedures:  none  Antimicrobials:   none    Subjective: Denies chest pains, nausea, vomiting.  Denies shortness of breath. Mild abdominal discomfort post surgery.   Objective:  Vitals:   02/09/16 1215 02/09/16 1230 02/09/16 1245 02/09/16 1259  BP: (!) 179/84 (!) 172/75  (!) 172/74  Pulse: 76 76  76  Resp: (!) 22 13  16   Temp:   97.5 F (36.4 C) 97.7 F (36.5 C)  TempSrc:      SpO2: 99% 99%   100%  Weight:      Height:        Intake/Output Summary (Last 24 hours) at 02/09/16 1707 Last data filed at 02/09/16 1530  Gross per 24 hour  Intake             3250 ml  Output              200 ml  Net             3050 ml   Filed Weights   02/04/16 1907  Weight: 44.4 kg (97 lb 14.2 oz)    Examination:  General exam:  Adult female, thin.  No acute distress.  Lying in bed HEENT:  NCAT, MMM Respiratory system: Clear to auscultation bilaterally Cardiovascular system: Regular rate and rhythm, normal S1/S2. No murmurs, rubs, gallops or clicks.  Warm extremities Gastrointestinal system: Hypoactive bowel sounds, soft, mildly distended, minimally tender to palpation diffusely.  Incisions closed with Dermabond MSK:  Normal tone and bulk, no lower extremity edema Neuro:  Grossly intact   Data Reviewed: I have personally reviewed following labs and imaging studies  CBC:  Recent Labs Lab 02/04/16 1205 02/05/16 1625 02/06/16 0545 02/07/16 0538 02/08/16 0428 02/09/16 0504  WBC 13.5* 9.2 7.7 7.4 6.5 8.2  NEUTROABS 10.8*  --   --   --   --   --   HGB 15.6* 12.5 10.7* 11.1* 12.3 11.8*  HCT 44.3 36.0 31.0* 32.2* 35.4* 34.8*  MCV 99.8 99.7 100.6* 101.6* 99.2 102.1*  PLT 255 208 160 172 190 0000000   Basic Metabolic Panel:  Recent Labs Lab 02/05/16 1625 02/06/16 0545 02/07/16 0538 02/08/16 0428 02/09/16 0504  NA 139 144 140 139 139  K 3.7 2.9* 3.9 4.2 4.0  CL 110 120* 118* 116* 117*  CO2 20* 17* 16* 17* 15*  GLUCOSE 157* 160* 93 114* 124*  BUN 29* 22* 15 13 14   CREATININE 2.65* 2.23* 2.16* 1.86* 1.72*  CALCIUM 8.3* 7.3* 7.5* 7.8* 7.4*   GFR: Estimated Creatinine Clearance: 19.8 mL/min (by C-G formula based on SCr of 1.72 mg/dL (H)). Liver Function Tests:  Recent Labs Lab 02/05/16 1625 02/06/16 0545 02/07/16 0538 02/08/16 0428 02/09/16 0504  AST 32 21 27 22 17   ALT 9* 10* 12* 11* 10*  ALKPHOS 46 41 43 50 45  BILITOT 1.0 0.7 0.8 0.7 1.0  PROT 6.7 5.5* 6.0* 6.4*  5.9*  ALBUMIN 3.2* 2.6* 2.8* 3.0* 2.7*    Recent Labs Lab 02/04/16 1205 02/05/16 0428 02/06/16 0545 02/07/16 0538  LIPASE 5,081* 1,679* 774* 289*   No results for input(s): AMMONIA in the last 168 hours. Coagulation Profile: No results for input(s): INR, PROTIME in the last 168 hours. Cardiac Enzymes:  Recent Labs Lab 02/05/16 1625  TROPONINI <0.03   BNP (last 3 results) No results for input(s): PROBNP in the last 8760 hours. HbA1C: No results for input(s): HGBA1C in the last 72 hours. CBG:  Recent Labs Lab 02/07/16 0812 02/07/16 1135 02/07/16 1809 02/07/16 2135 02/08/16 0752  GLUCAP 100* 106* 101* 111* 106*   Lipid Profile: No results for input(s): CHOL, HDL, LDLCALC, TRIG, CHOLHDL, LDLDIRECT in the last 72  hours. Thyroid Function Tests: No results for input(s): TSH, T4TOTAL, FREET4, T3FREE, THYROIDAB in the last 72 hours. Anemia Panel: No results for input(s): VITAMINB12, FOLATE, FERRITIN, TIBC, IRON, RETICCTPCT in the last 72 hours. Urine analysis:    Component Value Date/Time   COLORURINE AMBER (A) 02/04/2016 1103   APPEARANCEUR TURBID (A) 02/04/2016 1103   LABSPEC 1.020 02/04/2016 1103   PHURINE 5.5 02/04/2016 1103   GLUCOSEU NEGATIVE 02/04/2016 1103   HGBUR MODERATE (A) 02/04/2016 1103   BILIRUBINUR LARGE (A) 02/04/2016 1103   KETONESUR 40 (A) 02/04/2016 1103   PROTEINUR 100 (A) 02/04/2016 1103   NITRITE NEGATIVE 02/04/2016 1103   LEUKOCYTESUR NEGATIVE 02/04/2016 1103   Sepsis Labs: @LABRCNTIP (procalcitonin:4,lacticidven:4)  ) Recent Results (from the past 240 hour(s))  Urine culture     Status: None   Collection Time: 02/04/16 11:03 AM  Result Value Ref Range Status   Specimen Description URINE, RANDOM  Final   Special Requests NONE  Final   Culture   Final    NO GROWTH 1 DAY Performed at [facility]    Report Status 02/05/2016 FINAL  Final  Blood culture (routine x 2)     Status: None   Collection Time: 02/04/16 11:25 AM    Result Value Ref Range Status   Specimen Description BLOOD RIGHT FOREARM  Final   Special Requests IN PEDIATRIC BOTTLE 3CC  Final   Culture   Final    NO GROWTH 5 DAYS Performed at [facility]    Report Status 02/09/2016 FINAL  Final  Blood culture (routine x 2)     Status: None   Collection Time: 02/04/16 12:50 PM  Result Value Ref Range Status   Specimen Description BLOOD RIGHT HAND  Final   Special Requests IN PEDIATRIC BOTTLE [facility]  Final   Culture   Final    NO GROWTH 5 DAYS Performed at [facility]    Report Status 02/09/2016 FINAL  Final      Radiology Studies: Dg Cholangiogram Operative  Result Date: 02/09/2016 CLINICAL DATA:  Intraoperative cholangiogram during laparoscopic cholecystectomy. EXAM: INTRAOPERATIVE CHOLANGIOGRAM FLUOROSCOPY TIME:  40 seconds (100 mGy) COMPARISON:  CT abdomen and pelvis - 02/04/2016 FINDINGS: Intraoperative cholangiographic images of the right upper abdominal quadrant during laparoscopic cholecystectomy are provided for review. Contrast injection demonstrates opacification of the gallbladder. There are multiple persistent filling defects within the gallbladder lumen compatible with known extensive cholelithiasis. There is passage of contrast through the central aspect of the cystic duct with filling of a non dilated common bile duct. There is passage of contrast though the CBD and into the descending portion of the duodenum. There is minimal reflux of injected contrast into the common hepatic duct and central aspect of the non dilated intrahepatic biliary system. There are several persistent ill-defined nonocclusive filling defects within the distal aspect of the non dilated CBD worrisome for choledocholithiasis. IMPRESSION: Cholelithiasis with findings worrisome for choledocholithiasis. Correlation with the operative report is recommended. Electronically Signed   By: Sandi Mariscal M.D.   On: 02/09/2016 12:28     Scheduled Meds: .  amLODipine  5 mg Oral Daily  . atorvastatin  40 mg Oral q1800  . enoxaparin (LOVENOX) injection  30 mg Subcutaneous QHS  . feeding supplement  1 Container Oral TID BM  . fentaNYL      . labetalol  100 mg Oral BID  .  morphine injection  4 mg Intravenous Once   Continuous Infusions: . dextrose 5 % and  0.45 % NaCl with KCl 20 mEq/L 100 mL/hr at 02/09/16 0517     LOS: 5 days    Time spent: 30 min    Janece Canterbury, MD [facility] Pager [phone]  If 7PM-7AM, please contact night-coverage [email or web address] Password [email or web address] 02/09/2016, 5:07 PM

## 2016-02-09 NOTE — Anesthesia Postprocedure Evaluation (Signed)
Anesthesia Post Note  Patient: Kristine Shaffer  Procedure(s) Performed: Procedure(s) (LRB): LAPAROSCOPIC CHOLECYSTECTOMY WITH INTRAOPERATIVE CHOLANGIOGRAM (N/A)  Patient location during evaluation: PACU Anesthesia Type: General Level of consciousness: awake and alert Pain management: pain level controlled Vital Signs Assessment: post-procedure vital signs reviewed and stable Respiratory status: spontaneous breathing, nonlabored ventilation, respiratory function stable and patient connected to nasal cannula oxygen Cardiovascular status: blood pressure returned to baseline and stable Postop Assessment: no signs of nausea or vomiting Anesthetic complications: no    Last Vitals:  Vitals:   02/09/16 1245 02/09/16 1259  BP:  (!) 172/74  Pulse:  76  Resp:  16  Temp: 36.4 C 36.5 C    Last Pain:  Vitals:   02/09/16 1245  TempSrc:   PainSc: 4                  Filipe Greathouse J

## 2016-02-10 DIAGNOSIS — K8500 Idiopathic acute pancreatitis without necrosis or infection: Secondary | ICD-10-CM

## 2016-02-10 LAB — COMPREHENSIVE METABOLIC PANEL
ALBUMIN: 2.7 g/dL — AB (ref 3.5–5.0)
ALK PHOS: 144 U/L — AB (ref 38–126)
ALT: 81 U/L — ABNORMAL HIGH (ref 14–54)
ANION GAP: 9 (ref 5–15)
AST: 421 U/L — ABNORMAL HIGH (ref 15–41)
BUN: 14 mg/dL (ref 6–20)
CALCIUM: 7.1 mg/dL — AB (ref 8.9–10.3)
CHLORIDE: 116 mmol/L — AB (ref 101–111)
CO2: 14 mmol/L — AB (ref 22–32)
Creatinine, Ser: 1.9 mg/dL — ABNORMAL HIGH (ref 0.44–1.00)
GFR calc Af Amer: 29 mL/min — ABNORMAL LOW (ref >60)
GFR calc non Af Amer: 25 mL/min — ABNORMAL LOW (ref >60)
GLUCOSE: 116 mg/dL — AB (ref 65–99)
POTASSIUM: 4.5 mmol/L (ref 3.5–5.1)
SODIUM: 139 mmol/L (ref 135–145)
Total Bilirubin: 0.9 mg/dL (ref 0.3–1.2)
Total Protein: 5.8 g/dL — ABNORMAL LOW (ref 6.5–8.1)

## 2016-02-10 LAB — CBC
HEMATOCRIT: 34.5 % — AB (ref 36.0–46.0)
HEMOGLOBIN: 11.5 g/dL — AB (ref 12.0–15.0)
MCH: 34.1 pg — AB (ref 26.0–34.0)
MCHC: 33.3 g/dL (ref 30.0–36.0)
MCV: 102.4 fL — AB (ref 78.0–100.0)
Platelets: 222 10*3/uL (ref 150–400)
RBC: 3.37 MIL/uL — ABNORMAL LOW (ref 3.87–5.11)
RDW: 14.7 % (ref 11.5–15.5)
WBC: 8.5 10*3/uL (ref 4.0–10.5)

## 2016-02-10 NOTE — Progress Notes (Addendum)
  Progress Note: General Surgery Service   Subjective: Had some pain yesterday, now resolved, ambulating yesterday  Objective: Vital signs in last 24 hours: Temp:  [97.4 F (36.3 C)-98.9 F (37.2 C)] 98.9 F (37.2 C) (10/25 0414) Pulse Rate:  [74-90] 90 (10/25 0414) Resp:  [13-22] 19 (10/25 0414) BP: (132-181)/(64-84) 150/64 (10/25 0414) SpO2:  [99 %-100 %] 100 % (10/25 0414) Last BM Date: 02/08/16  Intake/Output from previous day: 10/24 0701 - 10/25 0700 In: 3960 [P.O.:210; I.V.:3750] Out: 202 [Urine:152; Blood:50] Intake/Output this shift: No intake/output data recorded.  Lungs: CTAB  Cardiovascular: RRR  Abd: soft, NT, ND, wounds c/d/i  Extremities: no edema  Neuro: AOx4  Lab Results: CBC   Recent Labs  02/09/16 0504 02/10/16 0515  WBC 8.2 8.5  HGB 11.8* 11.5*  HCT 34.8* 34.5*  PLT 188 222   BMET  Recent Labs  02/09/16 0504 02/10/16 0515  NA 139 139  K 4.0 4.5  CL 117* 116*  CO2 15* 14*  GLUCOSE 124* 116*  BUN 14 14  CREATININE 1.72* 1.90*  CALCIUM 7.4* 7.1*   PT/INR No results for input(s): LABPROT, INR in the last 72 hours. ABG No results for input(s): PHART, HCO3 in the last 72 hours.  Invalid input(s): PCO2, PO2  Studies/Results:  Anti-infectives: Anti-infectives    Start     Dose/Rate Route Frequency Ordered Stop   02/09/16 0800  cefoTEtan (CEFOTAN) 2 g in dextrose 5 % 50 mL IVPB     2 g 100 mL/hr over 30 Minutes Intravenous  Once 02/09/16 0745 02/09/16 1037   02/04/16 1345  piperacillin-tazobactam (ZOSYN) IVPB 3.375 g     3.375 g 100 mL/hr over 30 Minutes Intravenous  Once 02/04/16 1341 02/04/16 1450      Medications: Scheduled Meds: . amLODipine  5 mg Oral Daily  . atorvastatin  40 mg Oral q1800  . enoxaparin (LOVENOX) injection  30 mg Subcutaneous QHS  . feeding supplement  1 Container Oral TID BM  . labetalol  100 mg Oral BID  .  morphine injection  4 mg Intravenous Once   Continuous Infusions: . dextrose 5 % and  0.45 % NaCl with KCl 20 mEq/L 100 mL/hr at 02/09/16 2142   PRN Meds:.acetaminophen **OR** acetaminophen, hydrALAZINE, HYDROcodone-acetaminophen, iopamidol, ondansetron **OR** ondansetron (ZOFRAN) IV  Assessment/Plan: Patient Active Problem List   Diagnosis Date Noted  . Acute pancreatitis 02/04/2016  . Renal failure 02/04/2016  . Lactic acidosis 02/04/2016  . Benign essential HTN    s/p Procedure(s): LAPAROSCOPIC CHOLECYSTECTOMY WITH INTRAOPERATIVE CHOLANGIOGRAM 02/09/2016 nonobstructing stone seen at end of IOC yesterday, AST up today, bilirubin not increased today, currently assymptomatic -stone likely could be watched, but will get GI recommendations on the subject    LOS: 6 days   Mickeal Skinner, MD Pg# [phone] [facility]

## 2016-02-10 NOTE — Consult Note (Signed)
[facility] Reason for consult: gallstone pancreatitis with CBD stones Referring Physician: Dr Kieth Brightly. PCP: none stated. Primary G.I.: none patient is unassigned  Kristine Shaffer is an 75 y.o. female.  HPI: she was admitted to the hospital several days ago with gallstone pancreatitis. She had apparently been having intermittent abdominal pain for several weeks and then develop severe pain. She had a similar episode month earlier the results. Was found to have gallstones and pancreatitis with lipase 5000 initially. She improve dramatically with conservative therapy. She is a nondrinker. Laparoscopic cholecystectomy yesterday with IOC reveals several small filling defects in the CBD. The patient feels well now and is not having pain she reports that she is much better. She has CLEAR liquids. Repeat labs showed marked improvement in her lipase but increase in alkaline phosphatase and transaminases.  History reviewed. No pertinent past medical history.  Past Surgical History:  Procedure Laterality Date  . CHOLECYSTECTOMY N/A 02/09/2016   Procedure: LAPAROSCOPIC CHOLECYSTECTOMY WITH INTRAOPERATIVE CHOLANGIOGRAM;  Surgeon: Arta Bruce Kinsinger, MD;  Location: [facility];  Service: General;  Laterality: N/A;    History reviewed. No pertinent family history.  Social History:  reports that she has never smoked. She has never used smokeless tobacco. She reports that she does not drink alcohol or use drugs.  Allergies: No Known Allergies  Medications; Prior to Admission medications   Medication Sig Start Date End Date Taking? Authorizing Provider  cholecalciferol (VITAMIN D) 1000 units tablet Take 1,000 Units by mouth daily.   Yes Historical Provider, MD  ferrous sulfate 325 (65 FE) MG tablet Take 325 mg by mouth daily with breakfast.   Yes Historical Provider, MD  Multiple Vitamin (MULTIVITAMIN WITH MINERALS) TABS tablet Take 1 tablet by mouth daily.   Yes Historical Provider, MD   .  amLODipine  5 mg Oral Daily  . atorvastatin  40 mg Oral q1800  . enoxaparin (LOVENOX) injection  30 mg Subcutaneous QHS  . feeding supplement  1 Container Oral TID BM  . labetalol  100 mg Oral BID  .  morphine injection  4 mg Intravenous Once   PRN Meds acetaminophen **OR** acetaminophen, hydrALAZINE, HYDROcodone-acetaminophen, iopamidol, ondansetron **OR** ondansetron (ZOFRAN) IV Results for orders placed or performed during the hospital encounter of 02/04/16 (from the past 48 hour(s))  Comprehensive metabolic panel     Status: Abnormal   Collection Time: 02/09/16  5:04 AM  Result Value Ref Range   Sodium 139 135 - 145 mmol/L   Potassium 4.0 3.5 - 5.1 mmol/L   Chloride 117 (H) 101 - 111 mmol/L   CO2 15 (L) 22 - 32 mmol/L   Glucose, Bld 124 (H) 65 - 99 mg/dL   BUN 14 6 - 20 mg/dL   Creatinine, Ser 1.72 (H) 0.44 - 1.00 mg/dL   Calcium 7.4 (L) 8.9 - 10.3 mg/dL   Total Protein 5.9 (L) 6.5 - 8.1 g/dL   Albumin 2.7 (L) 3.5 - 5.0 g/dL   AST 17 15 - 41 U/L   ALT 10 (L) 14 - 54 U/L   Alkaline Phosphatase 45 38 - 126 U/L   Total Bilirubin 1.0 0.3 - 1.2 mg/dL   GFR calc non Af Amer 28 (L) >60 mL/min   GFR calc Af Amer 32 (L) >60 mL/min    Comment: (NOTE) The eGFR has been calculated using the CKD EPI equation. This calculation has not been validated in all clinical situations. eGFR's persistently <60 mL/min signify possible Chronic Kidney Disease.    Anion  gap 7 5 - 15  CBC     Status: Abnormal   Collection Time: 02/09/16  5:04 AM  Result Value Ref Range   WBC 8.2 4.0 - 10.5 K/uL   RBC 3.41 (L) 3.87 - 5.11 MIL/uL   Hemoglobin 11.8 (L) 12.0 - 15.0 g/dL   HCT 34.8 (L) 36.0 - 46.0 %   MCV 102.1 (H) 78.0 - 100.0 fL   MCH 34.6 (H) 26.0 - 34.0 pg   MCHC 33.9 30.0 - 36.0 g/dL   RDW 14.9 11.5 - 15.5 %   Platelets 188 150 - 400 K/uL  Comprehensive metabolic panel     Status: Abnormal   Collection Time: 02/10/16  5:15 AM  Result Value Ref Range   Sodium 139 135 - 145 mmol/L    Potassium 4.5 3.5 - 5.1 mmol/L   Chloride 116 (H) 101 - 111 mmol/L   CO2 14 (L) 22 - 32 mmol/L   Glucose, Bld 116 (H) 65 - 99 mg/dL   BUN 14 6 - 20 mg/dL   Creatinine, Ser 1.90 (H) 0.44 - 1.00 mg/dL   Calcium 7.1 (L) 8.9 - 10.3 mg/dL   Total Protein 5.8 (L) 6.5 - 8.1 g/dL   Albumin 2.7 (L) 3.5 - 5.0 g/dL   AST 421 (H) 15 - 41 U/L   ALT 81 (H) 14 - 54 U/L   Alkaline Phosphatase 144 (H) 38 - 126 U/L   Total Bilirubin 0.9 0.3 - 1.2 mg/dL   GFR calc non Af Amer 25 (L) >60 mL/min   GFR calc Af Amer 29 (L) >60 mL/min    Comment: (NOTE) The eGFR has been calculated using the CKD EPI equation. This calculation has not been validated in all clinical situations. eGFR's persistently <60 mL/min signify possible Chronic Kidney Disease.    Anion gap 9 5 - 15  CBC     Status: Abnormal   Collection Time: 02/10/16  5:15 AM  Result Value Ref Range   WBC 8.5 4.0 - 10.5 K/uL   RBC 3.37 (L) 3.87 - 5.11 MIL/uL   Hemoglobin 11.5 (L) 12.0 - 15.0 g/dL   HCT 34.5 (L) 36.0 - 46.0 %   MCV 102.4 (H) 78.0 - 100.0 fL   MCH 34.1 (H) 26.0 - 34.0 pg   MCHC 33.3 30.0 - 36.0 g/dL   RDW 14.7 11.5 - 15.5 %   Platelets 222 150 - 400 K/uL    Dg Cholangiogram Operative  Result Date: 02/09/2016 CLINICAL DATA:  Intraoperative cholangiogram during laparoscopic cholecystectomy. EXAM: INTRAOPERATIVE CHOLANGIOGRAM FLUOROSCOPY TIME:  40 seconds (100 mGy) COMPARISON:  CT abdomen and pelvis - 02/04/2016 FINDINGS: Intraoperative cholangiographic images of the right upper abdominal quadrant during laparoscopic cholecystectomy are provided for review. Contrast injection demonstrates opacification of the gallbladder. There are multiple persistent filling defects within the gallbladder lumen compatible with known extensive cholelithiasis. There is passage of contrast through the central aspect of the cystic duct with filling of a non dilated common bile duct. There is passage of contrast though the CBD and into the descending  portion of the duodenum. There is minimal reflux of injected contrast into the common hepatic duct and central aspect of the non dilated intrahepatic biliary system. There are several persistent ill-defined nonocclusive filling defects within the distal aspect of the non dilated CBD worrisome for choledocholithiasis. IMPRESSION: Cholelithiasis with findings worrisome for choledocholithiasis. Correlation with the operative report is recommended. Electronically Signed   By: Sandi Mariscal M.D.   On: 02/09/2016 12:28  Blood pressure (!) 143/109, pulse 76, temperature 97.9 F (36.6 C), temperature source Oral, resp. rate 14, height 5' (1.524 m), weight 44.4 kg (97 lb 14.2 oz), SpO2 97 %.  Physical exam:   General--Pleasant African-American female no acute distress ENT-- nonicteric Neck-- supple the lymphadenopathy Heart-- regular rate and rhythm without murmurs gallops Lungs-- clear Abdomen-- mild tenderness in the upper abdomen but good bowel sounds Psych-- alert and oriented. Answers questions appropriately is reading [facility].   Assessment: 1. Gallstone pancreatitis. IOC shows continued stone material in the CBD and she did bumper LFTs following surgery. I think she should have her common bile duct cleared prior to discharge. She has no significant medical problems and should tolerated procedure well.  Plan: 1. She is planned for ERCP with sphincterotomy and stone extraction tomorrow at 1130 by Dr. Watt Climes. We will pre-op with Cipro. She was not certain if she is allergic to penicillin or not. The procedure including the risk of bleeding and pancreatitis were explained to her and to her niece Gaylene. Orders were written.   Monai Hindes JR,Kazmir Oki L 02/10/2016, 2:41 PM   This note was created using voice recognition software and minor errors may Have occurred unintentionally. Pager: [phone] If no answer or after hours call [phone]

## 2016-02-10 NOTE — Progress Notes (Signed)
PROGRESS NOTE  Tylan Dipippo  [id number] DOB: 1940/05/31 DOA: 02/04/2016   PCP: No primary care provider on file.  Brief Narrative:  75 y.o. female with who has not seen a doctor in a long time and has no past medical history who presented with poor appetite for many years and intermittent abdominal pain for the last month.  On the day of admission, she developed nausea and nonbloody emesis.  In the ER, Lipase-5081, CT abdomen and pelvis demonstrated acute pancreatitis, cholelithiasis.  Incidentally, she was very hypertensive, her creatinine was elevated at 2.88 with unknown baseline, and her CT showed that her vagina appeared distended with possible fecal contents.  She was admitted and placed on IV fluids. Her lactic acid resolved. Her abdominal pain gradually improved and her lipase trended down. Her creatinine also improved. She underwent microscopic cholecystectomy on 10/24. She had what appeared to be some small nonobstructive gallstones in the nondilated common bile duct on intraoperative cholangiogram. Plan to repeat LFTs in the morning. If stable, and patient asymptomatic, will anticipate discharge on Wednesday.  Assessment & Plan: Acute pancreatitis, likely gallstone pancreatitis - abdominal pain resolved but having intermittent bilious emesis s/p cholecystectomy on 10/24 - IOC demonstrated some luminal irregularities in the nondilated CBD suggestive of some retained stones.  - Patient has been asymptomatic over the last few days but LFT's up this AM - I have requested GI consult for further recommendations - pt wants to go home but this will be deferred for now pending GI team recommendations   Acute kidney injury vs. Chronic kidney disease, stage III - with nongap metabolic acidosis, creatinine still up a bit, anticipate acidosis will improve gradually over time.   - Minimize nephrotoxins and renally dose medications - Nephrology follow-up outpatient  - BMP in AM  Essential  hypertension - blood pressure moderately elevated.  Severe LVH on ECHO suggests longstanding hypertension - Continue prn IV hydralazine  - Continue norvasc and labetalol  ECG changes and coronary calcifications seen on CT. Diffuse ST segment depressions now resolved - Troponin negative - ECHO with grade 2 DD and severe LVH  - Started statin, LDL 120  Short term memory loss and probable dementia - B12 borderline low, RPR NR - b12 injection 10/22  Possible stool in vagina on CT - No stool visible on external vaginal exam performed on 10/23. - GYN referral at discharge  Hyperglycemia, likely due to stress, A1c 4.4.  D/c SSI Hypokalemia, resolved with oral and IV potassium repletion Leukocytosis, resolved Tachycardia, resolved with IVF Elevated D dimer, suspect due to acute pancreatitis- hold off on further work up  DVT prophylaxis:  lovenox Code Status:  full Family Communication:  Pt at bedside, no family  Disposition Plan:  Home pending GI team recommendations  Consultants:   General surgery  GI  Antimicrobials:   None   Subjective: Denies chest pains, nausea, vomiting.  Denies shortness of breath.   Objective: Vitals:   02/09/16 1259 02/09/16 2141 02/10/16 0414 02/10/16 0936  BP: (!) 172/74 132/70 (!) 150/64 (!) 149/87  Pulse: 76 80 90 80  Resp: 16 19 19    Temp: 97.7 F (36.5 C) 98.4 F (36.9 C) 98.9 F (37.2 C)   TempSrc:   Oral   SpO2: 100% 99% 100%   Weight:      Height:        Intake/Output Summary (Last 24 hours) at 02/10/16 1042 Last data filed at 02/10/16 0700  Gross per 24 hour  Intake  3960 ml  Output              202 ml  Net             3758 ml   Filed Weights   02/04/16 1907  Weight: 44.4 kg (97 lb 14.2 oz)    Examination:  General exam:  Adult female, thin.  No acute distress.  Lying in bed HEENT:  NCAT, MMM Respiratory system: Clear to auscultation bilaterally Cardiovascular system: Regular rate and rhythm, normal  S1/S2. No murmurs, rubs, gallops or clicks.  Warm extremities Gastrointestinal system: Hypoactive bowel sounds, soft, mildly distended, minimally tender to palpation diffusely.  Incisions closed with Dermabond MSK:  Normal tone and bulk, no lower extremity edema Neuro:  Grossly intact   Data Reviewed: I have personally reviewed following labs and imaging studies  CBC:  Recent Labs Lab 02/04/16 1205  02/06/16 0545 02/07/16 0538 02/08/16 0428 02/09/16 0504 02/10/16 0515  WBC 13.5*  < > 7.7 7.4 6.5 8.2 8.5  NEUTROABS 10.8*  --   --   --   --   --   --   HGB 15.6*  < > 10.7* 11.1* 12.3 11.8* 11.5*  HCT 44.3  < > 31.0* 32.2* 35.4* 34.8* 34.5*  MCV 99.8  < > 100.6* 101.6* 99.2 102.1* 102.4*  PLT 255  < > 160 172 190 188 222  < > = values in this interval not displayed. Basic Metabolic Panel:  Recent Labs Lab 02/06/16 0545 02/07/16 0538 02/08/16 0428 02/09/16 0504 02/10/16 0515  NA 144 140 139 139 139  K 2.9* 3.9 4.2 4.0 4.5  CL 120* 118* 116* 117* 116*  CO2 17* 16* 17* 15* 14*  GLUCOSE 160* 93 114* 124* 116*  BUN 22* 15 13 14 14   CREATININE 2.23* 2.16* 1.86* 1.72* 1.90*  CALCIUM 7.3* 7.5* 7.8* 7.4* 7.1*   Liver Function Tests:  Recent Labs Lab 02/06/16 0545 02/07/16 0538 02/08/16 0428 02/09/16 0504 02/10/16 0515  AST 21 27 22 17  421*  ALT 10* 12* 11* 10* 81*  ALKPHOS 41 43 50 45 144*  BILITOT 0.7 0.8 0.7 1.0 0.9  PROT 5.5* 6.0* 6.4* 5.9* 5.8*  ALBUMIN 2.6* 2.8* 3.0* 2.7* 2.7*    Recent Labs Lab 02/04/16 1205 02/05/16 0428 02/06/16 0545 02/07/16 0538  LIPASE 5,081* 1,679* 774* 289*   Cardiac Enzymes:  Recent Labs Lab 02/05/16 1625  TROPONINI <0.03   CBG:  Recent Labs Lab 02/07/16 0812 02/07/16 1135 02/07/16 1809 02/07/16 2135 02/08/16 0752  GLUCAP 100* 106* 101* 111* 106*   Urine analysis:    Component Value Date/Time   COLORURINE AMBER (A) 02/04/2016 1103   APPEARANCEUR TURBID (A) 02/04/2016 1103   LABSPEC 1.020 02/04/2016 1103    PHURINE 5.5 02/04/2016 1103   GLUCOSEU NEGATIVE 02/04/2016 1103   HGBUR MODERATE (A) 02/04/2016 1103   BILIRUBINUR LARGE (A) 02/04/2016 1103   KETONESUR 40 (A) 02/04/2016 1103   PROTEINUR 100 (A) 02/04/2016 1103   NITRITE NEGATIVE 02/04/2016 1103   LEUKOCYTESUR NEGATIVE 02/04/2016 1103   Recent Results (from the past 240 hour(s))  Urine culture     Status: None   Collection Time: 02/04/16 11:03 AM  Result Value Ref Range Status   Specimen Description URINE, RANDOM  Final   Special Requests NONE  Final   Culture   Final    NO GROWTH 1 DAY Performed at [facility]    Report Status 02/05/2016 FINAL  Final  Blood culture (routine x  2)     Status: None   Collection Time: 02/04/16 11:25 AM  Result Value Ref Range Status   Specimen Description BLOOD RIGHT FOREARM  Final   Special Requests IN PEDIATRIC BOTTLE 3CC  Final   Culture   Final    NO GROWTH 5 DAYS Performed at [facility]    Report Status 02/09/2016 FINAL  Final  Blood culture (routine x 2)     Status: None   Collection Time: 02/04/16 12:50 PM  Result Value Ref Range Status   Specimen Description BLOOD RIGHT HAND  Final   Special Requests IN PEDIATRIC BOTTLE [facility]  Final   Culture   Final    NO GROWTH 5 DAYS Performed at [facility]    Report Status 02/09/2016 FINAL  Final      Radiology Studies: Dg Cholangiogram Operative  Result Date: 02/09/2016 CLINICAL DATA:  Intraoperative cholangiogram during laparoscopic cholecystectomy. EXAM: INTRAOPERATIVE CHOLANGIOGRAM FLUOROSCOPY TIME:  40 seconds (100 mGy) COMPARISON:  CT abdomen and pelvis - 02/04/2016 FINDINGS: Intraoperative cholangiographic images of the right upper abdominal quadrant during laparoscopic cholecystectomy are provided for review. Contrast injection demonstrates opacification of the gallbladder. There are multiple persistent filling defects within the gallbladder lumen compatible with known extensive cholelithiasis. There is passage  of contrast through the central aspect of the cystic duct with filling of a non dilated common bile duct. There is passage of contrast though the CBD and into the descending portion of the duodenum. There is minimal reflux of injected contrast into the common hepatic duct and central aspect of the non dilated intrahepatic biliary system. There are several persistent ill-defined nonocclusive filling defects within the distal aspect of the non dilated CBD worrisome for choledocholithiasis. IMPRESSION: Cholelithiasis with findings worrisome for choledocholithiasis. Correlation with the operative report is recommended. Electronically Signed   By: Sandi Mariscal M.D.   On: 02/09/2016 12:28     Scheduled Meds: . amLODipine  5 mg Oral Daily  . atorvastatin  40 mg Oral q1800  . enoxaparin (LOVENOX) injection  30 mg Subcutaneous QHS  . feeding supplement  1 Container Oral TID BM  . labetalol  100 mg Oral BID  .  morphine injection  4 mg Intravenous Once   Continuous Infusions: . dextrose 5 % and 0.45 % NaCl with KCl 20 mEq/L 100 mL/hr at 02/09/16 2142     LOS: 6 days   Time spent: 30 min  Faye Ramsay, MD [facility] Pager [phone]  If 7PM-7AM, please contact night-coverage [email or web address] Password [email or web address] 02/10/2016, 10:42 AM

## 2016-02-11 ENCOUNTER — Inpatient Hospital Stay (HOSPITAL_COMMUNITY): Payer: Medicare Other

## 2016-02-11 ENCOUNTER — Inpatient Hospital Stay (HOSPITAL_COMMUNITY): Payer: Medicare Other | Admitting: Certified Registered Nurse Anesthetist

## 2016-02-11 ENCOUNTER — Encounter (HOSPITAL_COMMUNITY)
Admission: EM | Disposition: A | Discharge status: Home / Self Care | Payer: Self-pay | Source: Home / Self Care | Attending: Internal Medicine

## 2016-02-11 ENCOUNTER — Encounter (HOSPITAL_COMMUNITY): Payer: Self-pay | Admitting: *Deleted

## 2016-02-11 HISTORY — PX: ERCP: SHX5425

## 2016-02-11 LAB — COMPREHENSIVE METABOLIC PANEL
ALBUMIN: 2.8 g/dL — AB (ref 3.5–5.0)
ALT: 60 U/L — AB (ref 14–54)
AST: 93 U/L — AB (ref 15–41)
Alkaline Phosphatase: 128 U/L — ABNORMAL HIGH (ref 38–126)
Anion gap: 5 (ref 5–15)
BUN: 10 mg/dL (ref 6–20)
CHLORIDE: 118 mmol/L — AB (ref 101–111)
CO2: 17 mmol/L — ABNORMAL LOW (ref 22–32)
CREATININE: 1.69 mg/dL — AB (ref 0.44–1.00)
Calcium: 7 mg/dL — ABNORMAL LOW (ref 8.9–10.3)
GFR calc Af Amer: 33 mL/min — ABNORMAL LOW (ref >60)
GFR calc non Af Amer: 28 mL/min — ABNORMAL LOW (ref >60)
GLUCOSE: 102 mg/dL — AB (ref 65–99)
POTASSIUM: 4.4 mmol/L (ref 3.5–5.1)
Sodium: 140 mmol/L (ref 135–145)
Total Bilirubin: 0.7 mg/dL (ref 0.3–1.2)
Total Protein: 6 g/dL — ABNORMAL LOW (ref 6.5–8.1)

## 2016-02-11 LAB — CBC
HEMATOCRIT: 32.1 % — AB (ref 36.0–46.0)
Hemoglobin: 10.9 g/dL — ABNORMAL LOW (ref 12.0–15.0)
MCH: 34.4 pg — AB (ref 26.0–34.0)
MCHC: 34 g/dL (ref 30.0–36.0)
MCV: 101.3 fL — AB (ref 78.0–100.0)
Platelets: 219 10*3/uL (ref 150–400)
RBC: 3.17 MIL/uL — ABNORMAL LOW (ref 3.87–5.11)
RDW: 14.7 % (ref 11.5–15.5)
WBC: 6.6 10*3/uL (ref 4.0–10.5)

## 2016-02-11 SURGERY — ERCP, WITH INTERVENTION IF INDICATED
Anesthesia: Monitor Anesthesia Care

## 2016-02-11 SURGERY — ERCP, WITH INTERVENTION IF INDICATED
Anesthesia: General

## 2016-02-11 MED ORDER — FENTANYL CITRATE (PF) 100 MCG/2ML IJ SOLN
INTRAMUSCULAR | Status: AC
  Filled 2016-02-11: qty 2

## 2016-02-11 MED ORDER — IOPAMIDOL (ISOVUE-300) INJECTION 61%
INTRAVENOUS | Status: DC | PRN
  Administered 2016-02-11: 13:00:00

## 2016-02-11 MED ORDER — LIDOCAINE 2% (20 MG/ML) 5 ML SYRINGE
INTRAMUSCULAR | Status: DC | PRN
  Administered 2016-02-11: 50 mg via INTRAVENOUS

## 2016-02-11 MED ORDER — CIPROFLOXACIN IN D5W 400 MG/200ML IV SOLN
400.0000 mg | Freq: Once | INTRAVENOUS | Status: AC
  Administered 2016-02-11: 400 mg via INTRAVENOUS

## 2016-02-11 MED ORDER — FENTANYL CITRATE (PF) 100 MCG/2ML IJ SOLN: 25.0000 ug | INTRAMUSCULAR | Status: DC | PRN

## 2016-02-11 MED ORDER — FENTANYL CITRATE (PF) 100 MCG/2ML IJ SOLN
INTRAMUSCULAR | Status: DC | PRN
  Administered 2016-02-11 (×2): 50 ug via INTRAVENOUS

## 2016-02-11 MED ORDER — SODIUM CHLORIDE 0.9 % IV SOLN
INTRAVENOUS | Status: DC
  Administered 2016-02-11: 12:00:00 via INTRAVENOUS

## 2016-02-11 MED ORDER — PROMETHAZINE HCL 25 MG/ML IJ SOLN: 6.2500 mg | INTRAMUSCULAR | Status: DC | PRN

## 2016-02-11 MED ORDER — SUCCINYLCHOLINE CHLORIDE 200 MG/10ML IV SOSY
PREFILLED_SYRINGE | INTRAVENOUS | Status: DC | PRN
  Administered 2016-02-11: 80 mg via INTRAVENOUS

## 2016-02-11 MED ORDER — CIPROFLOXACIN IN D5W 400 MG/200ML IV SOLN
INTRAVENOUS | Status: AC
  Filled 2016-02-11: qty 200

## 2016-02-11 MED ORDER — ONDANSETRON HCL 4 MG/2ML IJ SOLN
INTRAMUSCULAR | Status: DC | PRN
  Administered 2016-02-11: 4 mg via INTRAVENOUS

## 2016-02-11 MED ORDER — ONDANSETRON HCL 4 MG/2ML IJ SOLN
INTRAMUSCULAR | Status: AC
  Filled 2016-02-11: qty 2

## 2016-02-11 MED ORDER — LABETALOL HCL 5 MG/ML IV SOLN
INTRAVENOUS | Status: DC | PRN
  Administered 2016-02-11: 5 mg via INTRAVENOUS

## 2016-02-11 MED ORDER — SUCCINYLCHOLINE CHLORIDE 20 MG/ML IJ SOLN
INTRAMUSCULAR | Status: AC
Start: 2016-02-11 — End: 2016-02-11
  Filled 2016-02-11: qty 1

## 2016-02-11 MED ORDER — GLUCAGON HCL RDNA (DIAGNOSTIC) 1 MG IJ SOLR
INTRAMUSCULAR | Status: AC
  Filled 2016-02-11: qty 1

## 2016-02-11 MED ORDER — LABETALOL HCL 5 MG/ML IV SOLN
INTRAVENOUS | Status: AC
  Filled 2016-02-11: qty 4

## 2016-02-11 MED ORDER — PROPOFOL 10 MG/ML IV BOLUS
INTRAVENOUS | Status: DC | PRN
  Administered 2016-02-11: 100 mg via INTRAVENOUS

## 2016-02-11 MED ORDER — LIDOCAINE 2% (20 MG/ML) 5 ML SYRINGE
INTRAMUSCULAR | Status: AC
  Filled 2016-02-11: qty 5

## 2016-02-11 NOTE — Anesthesia Postprocedure Evaluation (Signed)
Anesthesia Post Note  Patient: Kristine Shaffer  Procedure(s) Performed: Procedure(s) (LRB): ENDOSCOPIC RETROGRADE CHOLANGIOPANCREATOGRAPHY (ERCP) (N/A)  Patient location during evaluation: PACU Anesthesia Type: General Level of consciousness: awake and alert Pain management: pain level controlled Vital Signs Assessment: post-procedure vital signs reviewed and stable Respiratory status: spontaneous breathing, nonlabored ventilation, respiratory function stable and patient connected to nasal cannula oxygen Cardiovascular status: blood pressure returned to baseline and stable Postop Assessment: no signs of nausea or vomiting Anesthetic complications: no    Last Vitals:  Vitals:   02/11/16 1330 02/11/16 1428  BP: (!) 193/72 (!) 158/73  Pulse:  75  Resp:  19  Temp:  36.4 C    Last Pain:  Vitals:   02/11/16 1428  TempSrc: Oral  PainSc:                  Damir Leung J

## 2016-02-11 NOTE — Progress Notes (Signed)
Kristine Shaffer 12:05 PM  Subjective: Patient without any complaints and we rediscussed the procedure and the indications and she has no additional questions and her hospital computer chart was reviewed and her case discussed with my partner Dr. Oletta Lamas  Objective: Vital signs stable afebrile no acute distress exam please see preassessment evaluation liver tests improved CBC stable IOC reviewed CT reviewed  Assessment: Probable CBD stones  Plan: I rediscussed the risk benefit methods and success rate of ERCP with the patient and will proceed today with anesthesia assistance  [facility]  Pager [phone] After 5PM or if no answer call [phone]

## 2016-02-11 NOTE — Progress Notes (Signed)
Nutrition Follow-up  DOCUMENTATION CODES:   Non-severe (moderate) malnutrition in context of acute illness/injury  INTERVENTION:  As patient has had inadequate PO intake for at least 7 days that continues to worsen and is also malnourished, recommend placing NG/NJ tube and initiating tube feeding by day 10 of inadequate intake (10/29).   If patient remains unable to meet needs through PO intake, recommend initiating Jevity 1.2 @ 15 ml/hr, then advancing by 15 ml/hr Q8hrs to goal of Jevity 1.2 @ 45 ml/hr. Goal regimen provides 1296 kcal, 60 grams protein, 875 ml H2O daily.  Continue Boost Breeze po TID, each supplement provides 250 kcal and 9 grams of protein.  Encouraged patient to obtain adequate protein and calories from meals and snacks.    NUTRITION DIAGNOSIS:   Malnutrition related to acute illness as evidenced by percent weight loss, energy intake < 75% for > 7 days, mild depletion of body fat.  Ongoing.  GOAL:   Patient will meet greater than or equal to 90% of their needs  Not met.  MONITOR:   PO intake, Supplement acceptance, Labs, Weight trends, I & O's  REASON FOR ASSESSMENT:   Consult Assessment of nutrition requirement/status  ASSESSMENT:   75 y.o. female with who has not seen a doctor in a long time and has no past medical history who presented with poor appetite for many years and intermittent abdominal pain for the last month.  On the day of admission, she developed nausea and nonbloody emesis.  In the ER, Lipase-5081, CT abdomen and pelvis demonstrated acute pancreatitis, cholelithiasis.   -Patient s/p laparoscopic cholecystectomy on 10/24  Patient reports she has not ordered any food to eat in 3 days. She reports she has a poor appetite and does not feel like eating. Denies N/V or abdominal pain. She is NPO today for ERCP. She also reports she is not drinking the [employer] regularly.  Spoke with RN who confirms patient has not been ordering meals and  only taking sips of Boost Breeze.  Patient has had 0 kcal and 0 protein in the past 24 hours, and presumably the past 3 days. She is now day 7 of inadequate PO intake.   Medications reviewed and include: D5-1/2NS with KCl 20 mEq/L @ 100 ml/hr (120 grams dextrose, 408 kcal daily).   Labs reviewed: Chloride 118, CO2 17, Creatinine 1.69, Glucose 102.   Diet Order:  Diet clear liquid Room service appropriate? Yes; Fluid consistency: Thin  Skin:  Reviewed, no issues  Last BM:  02/08/2016  Height:   Ht Readings from Last 1 Encounters:  02/04/16 5' (1.524 m)    Weight:   Wt Readings from Last 1 Encounters:  02/04/16 97 lb 14.2 oz (44.4 kg)    Ideal Body Weight:  45.5 kg  BMI:  Body mass index is 19.12 kg/m.  Estimated Nutritional Needs:   Kcal:  1200-1400  Protein:  62-71 grams  Fluid:  1.4L/day  EDUCATION NEEDS:   No education needs identified at this time  Willey Blade, MS, RD, LDN Pager: [phone] After Hours Pager: [phone]

## 2016-02-11 NOTE — Progress Notes (Signed)
Patient ID: Kristine Shaffer, female   DOB: [birth year], 75 y.o.   MRN: [id number]  [facility] Surgery Progress Note  2 Days Post-Op  Subjective: Feeling well today, denies any abdominal pain. Minimal PO intake yesterday. Scheduled for ERCP today.  Objective: Vital signs in last 24 hours: Temp:  [97.9 F (36.6 C)-98.4 F (36.9 C)] 97.9 F (36.6 C) (10/26 0543) Pulse Rate:  [76-85] 80 (10/26 0543) Resp:  [13-14] 13 (10/26 0543) BP: (143-156)/(67-109) 147/67 (10/26 0543) SpO2:  [97 %-100 %] 100 % (10/26 0543) Last BM Date: 02/08/16  Intake/Output from previous day: 10/25 0701 - 10/26 0700 In: 2540 [P.O.:240; I.V.:2300] Out: 600 [Urine:600] Intake/Output this shift: No intake/output data recorded.  PE: Gen:  Alert, NAD, pleasant Pulm:  Effort normal Abd: Soft, NT/ND, +BS, incisions C/D/I  Lab Results:   Recent Labs  02/10/16 0515 02/11/16 0515  WBC 8.5 6.6  HGB 11.5* 10.9*  HCT 34.5* 32.1*  PLT 222 219   BMET  Recent Labs  02/10/16 0515 02/11/16 0515  NA 139 140  K 4.5 4.4  CL 116* 118*  CO2 14* 17*  GLUCOSE 116* 102*  BUN 14 10  CREATININE 1.90* 1.69*  CALCIUM 7.1* 7.0*   PT/INR No results for input(s): LABPROT, INR in the last 72 hours. CMP     Component Value Date/Time   NA 140 02/11/2016 0515   K 4.4 02/11/2016 0515   CL 118 (H) 02/11/2016 0515   CO2 17 (L) 02/11/2016 0515   GLUCOSE 102 (H) 02/11/2016 0515   BUN 10 02/11/2016 0515   CREATININE 1.69 (H) 02/11/2016 0515   CALCIUM 7.0 (L) 02/11/2016 0515   PROT 6.0 (L) 02/11/2016 0515   ALBUMIN 2.8 (L) 02/11/2016 0515   AST 93 (H) 02/11/2016 0515   ALT 60 (H) 02/11/2016 0515   ALKPHOS 128 (H) 02/11/2016 0515   BILITOT 0.7 02/11/2016 0515   GFRNONAA 28 (L) 02/11/2016 0515   GFRAA 33 (L) 02/11/2016 0515   Lipase     Component Value Date/Time   LIPASE 289 (H) 02/07/2016 0538       Studies/Results: Dg Cholangiogram Operative  Result Date: 02/09/2016 CLINICAL DATA:  Intraoperative  cholangiogram during laparoscopic cholecystectomy. EXAM: INTRAOPERATIVE CHOLANGIOGRAM FLUOROSCOPY TIME:  40 seconds (100 mGy) COMPARISON:  CT abdomen and pelvis - 02/04/2016 FINDINGS: Intraoperative cholangiographic images of the right upper abdominal quadrant during laparoscopic cholecystectomy are provided for review. Contrast injection demonstrates opacification of the gallbladder. There are multiple persistent filling defects within the gallbladder lumen compatible with known extensive cholelithiasis. There is passage of contrast through the central aspect of the cystic duct with filling of a non dilated common bile duct. There is passage of contrast though the CBD and into the descending portion of the duodenum. There is minimal reflux of injected contrast into the common hepatic duct and central aspect of the non dilated intrahepatic biliary system. There are several persistent ill-defined nonocclusive filling defects within the distal aspect of the non dilated CBD worrisome for choledocholithiasis. IMPRESSION: Cholelithiasis with findings worrisome for choledocholithiasis. Correlation with the operative report is recommended. Electronically Signed   By: Sandi Mariscal M.D.   On: 02/09/2016 12:28    Anti-infectives: Anti-infectives    Start     Dose/Rate Route Frequency Ordered Stop   02/09/16 0800  cefoTEtan (CEFOTAN) 2 g in dextrose 5 % 50 mL IVPB     2 g 100 mL/hr over 30 Minutes Intravenous  Once 02/09/16 0745 02/09/16 1037   02/04/16 1345  piperacillin-tazobactam (ZOSYN) IVPB 3.375 g     3.375 g 100 mL/hr over 30 Minutes Intravenous  Once 02/04/16 1341 02/04/16 1450       Assessment/Plan s/p Procedure(s): LAPAROSCOPIC CHOLECYSTECTOMY WITH INTRAOPERATIVE CHOLANGIOGRAM 02/09/2016 [place] - POD 2 - elevated LFT's postoperatively, trending down today (AST/ALT/alk phos/bili 93/60/128/0.7) - scheduled for ERCP today  Plan - ERCP today, otherwise ready for discharge from a surgery  standpoint. Follow-up with Dr. Kieth Brightly in 3 weeks.     LOS: 7 days    Jerrye Beavers , [facility] Surgery 02/11/2016, 9:03 AM Pager: [phone] Consults: [phone] Mon-Fri 7:00 am-4:30 pm Sat-Sun 7:00 am-11:30 am

## 2016-02-11 NOTE — Transfer of Care (Signed)
Immediate Anesthesia Transfer of Care Note  Patient: Kristine Shaffer  Procedure(s) Performed: Procedure(s): ENDOSCOPIC RETROGRADE CHOLANGIOPANCREATOGRAPHY (ERCP) (N/A)  Patient Location: PACU  Anesthesia Type:General  Level of Consciousness:  sedated, patient cooperative and responds to stimulation  Airway & Oxygen Therapy:Patient Spontanous Breathing and Patient connected to face mask oxgen  Post-op Assessment:  Report given to PACU RN and Post -op Vital signs reviewed and stable  Post vital signs:  Reviewed and stable  Last Vitals:  Vitals:   02/11/16 0543 02/11/16 1202  BP: (!) 147/67 (!) 196/57  Pulse: 80 78  Resp: 13 14  Temp: 36.6 C [id number] C    Complications: No apparent anesthesia complications

## 2016-02-11 NOTE — Op Note (Signed)
[facility] Patient Name: Kristine Shaffer Procedure Date: 02/11/2016 MRN: [id number] Attending MD: Clarene Essex , MD Date of Birth: [birth year] CSN: [id number] Age: 75 Admit Type: Inpatient Procedure:                ERCP Indications:              Suspected bile duct stone(s)positive Intra-Op                            cholangiogram increased liver tests Providers:                Clarene Essex, MD, Cleda Daub, RN, Alfonso Patten,                            Technician, Adair Laundry, CRNA Referring MD:              Medicines:                General Anesthesia Complications:            No immediate complications. Estimated Blood Loss:     Estimated blood loss: none. Procedure:                Pre-Anesthesia Assessment:                           - Prior to the procedure, a History and Physical                            was performed, and patient medications and                            allergies were reviewed. The patient's tolerance of                            previous anesthesia was also reviewed. The risks                            and benefits of the procedure and the sedation                            options and risks were discussed with the patient.                            All questions were answered, and informed consent                            was obtained. Prior Anticoagulants: The patient has                            taken Lovenox (enoxaparin), last dose was 1 day                            prior to procedure. ASA Grade Assessment: II - A  patient with mild systemic disease. After reviewing                            the risks and benefits, the patient was deemed in                            satisfactory condition to undergo the procedure.                           After obtaining informed consent, the scope was                            passed under direct vision. Throughout the                            procedure, the  patient's blood pressure, pulse, and                            oxygen saturations were monitored continuously. The                            [phone]) scope was introduced through                            the mouth, and used to inject contrast into and                            used to locate the major papilla. The ERCP was                            accomplished without difficulty. The patient                            tolerated the procedure well. Scope In: Scope Out: Findings:      The major papilla was slightly      bulbous ampulla,After multiple attempts at cannulation deep selective       cannulation was obtained and we proceeded with the Biliary       sphincterotomy was made with a Hydratome sphincterotome using ERBE       electrocautery. until we had adequate biliary drainage but there was a       distal stricture so we could not advance the fully bowed sphincterotome       into the duct There was no post-sphincterotomy bleeding.       Choledocholithiasis wasnot obviously found in a nondilated duct. The       lower third of the main bile duct contained localized stenoses. To size       the object(s), the biliary tree was swept with a 9-12 mm balloon       starting at the bifurcation. Nothing was found. the 9 mm balloon passed       readily through the sphincterotomy site in the 12th passed with minimal       resistanceDilation of the distal common bile duct with a 4 cm by 8 mm       balloon dilator was successful.  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 The                             stricture was benign appearing. This stricture was                            treated with biliary sphincterotomy and dilation as                            above.                           - Choledocholithiasis was not found. balloon                            pull-through as above.                           - A sphincterotomy was performed.                           - The biliary tree was swept and nothing was found.                           - Common bile duct was successfully dilated. no                            pancreatic duct injection or wire advancement as                            above Moderate Sedation:      N/A- Per Anesthesia Care Recommendation:           - Clear liquid diet today. may slowly advance                            tomorrow if okay                           - Continue present medications.                           - Check liver enzymes (AST, ALT, alkaline                            phosphatase, bilirubin) tomorrow.                           -  Return to GI clinic PRN.                           - Telephone GI clinic if symptomatic PRN. Procedure Code(s):        --- Professional ---                           [phone], Esophagogastroduodenoscopy, flexible,                            transoral; diagnostic, including collection of                            specimen(s) by brushing or washing, when performed                            (separate procedure) Diagnosis Code(s):        --- Professional ---                           K80.51, Calculus of bile duct without cholangitis                            or cholecystitis with obstruction CPT copyright 2016 [employer]. All rights reserved. The codes documented in this report are preliminary and upon coder review may  be revised to meet current compliance requirements. Clarene Essex, MD 02/11/2016 1:29:46 PM This report has been signed electronically. Number of Addenda: 0

## 2016-02-11 NOTE — Anesthesia Procedure Notes (Signed)
Procedure Name: Intubation Date/Time: 02/11/2016 12:18 PM Performed by: Montel Clock Pre-anesthesia Checklist: Patient identified, Emergency Drugs available, Suction available, Patient being monitored and Timeout performed Patient Re-evaluated:Patient Re-evaluated prior to inductionOxygen Delivery Method: Circle system utilized Preoxygenation: Pre-oxygenation with 100% oxygen Intubation Type: IV induction Ventilation: Mask ventilation without difficulty Laryngoscope Size: Mac and 3 Grade View: Grade I Tube type: Oral Tube size: 7.5 mm Number of attempts: 1 Airway Equipment and Method: Stylet Placement Confirmation: ETT inserted through vocal cords under direct vision,  positive ETCO2 and breath sounds checked- equal and bilateral Secured at: 21 cm Tube secured with: Tape Dental Injury: Teeth and Oropharynx as per pre-operative assessment

## 2016-02-11 NOTE — Progress Notes (Signed)
PROGRESS NOTE  Kristine Shaffer  [id number] DOB: 1940/08/11 DOA: 02/04/2016   PCP: No primary care provider on file.  Brief Narrative:  75 y.o. female with who has not seen a doctor in a long time and has no past medical history who presented with poor appetite for many years and intermittent abdominal pain for the last month.  On the day of admission, she developed nausea and nonbloody emesis.  In the ER, [id number], CT abdomen and pelvis demonstrated acute pancreatitis, cholelithiasis.  Incidentally, she was very hypertensive, her creatinine was elevated at 2.88 with unknown baseline, and her CT showed that her vagina appeared distended with possible fecal contents.  She was admitted and placed on IV fluids. Her lactic acid resolved. Her abdominal pain gradually improved and her lipase trended down. Her creatinine also improved. She underwent microscopic cholecystectomy on 10/24. She had what appeared to be some small nonobstructive gallstones in the nondilated common bile duct on intraoperative cholangiogram. Plan to repeat LFTs in the morning. If stable, and patient asymptomatic, will anticipate discharge on Wednesday.  Assessment & Plan: Acute pancreatitis, likely gallstone pancreatitis - abdominal pain resolved but having intermittent bilious emesis s/p cholecystectomy on 10/24 - IOC demonstrated some luminal irregularities in the nondilated CBD suggestive of some retained stones.  - Patient has been asymptomatic over the last few days but LFT's still elevated  - plan for ERCP today per GI team   Acute kidney injury vs. Chronic kidney disease, stage III - with nongap metabolic acidosis, creatinine better this AM, trending down from 1.9 --> 1.69 - Minimize nephrotoxins and renally dose medications - Nephrology follow-up outpatient  - BMP in AM  Essential hypertension - blood pressure elevated this AM, will ask RN to repeat  - Continue prn IV hydralazine  - Continue norvasc and  labetalol  ECG changes and coronary calcifications seen on CT. Diffuse ST segment depressions now resolved - Troponin negative - ECHO with grade 2 DD and severe LVH  - Started statin, LDL 120  Short term memory loss and probable dementia - B12 borderline low, RPR NR - b12 injection 10/22  Possible stool in vagina on CT - No stool visible on external vaginal exam performed on 10/23. - GYN referral at discharge  Hyperglycemia, likely due to stress, A1c 4.4.  D/c SSI Hypokalemia, resolved with oral and IV potassium repletion Leukocytosis, resolved Tachycardia, resolved with IVF Elevated D dimer, suspect due to acute pancreatitis- hold off on further work up  DVT prophylaxis:  lovenox Code Status:  full Family Communication:  Pt at bedside, no family  Disposition Plan:  Home pending ERCP results   Consultants:   General surgery  GI  Antimicrobials:   None   Subjective: Denies chest pains, nausea, vomiting.  Denies shortness of breath.   Objective: Vitals:   02/10/16 1318 02/10/16 2216 02/11/16 0543 02/11/16 1202  BP: (!) 143/109 (!) 156/67 (!) 147/67 (!) 196/57  Pulse: 76 85 80 78  Resp: 14 14 13 14   Temp: 97.9 F (36.6 C) 98.4 F (36.9 C) 97.9 F (36.6 C) 98.1 F (36.7 C)  TempSrc: Oral Oral Oral Oral  SpO2: 97% 100% 100% 100%  Weight:      Height:        Intake/Output Summary (Last 24 hours) at 02/11/16 1217 Last data filed at 02/11/16 0936  Gross per 24 hour  Intake             2240 ml  Output  900 ml  Net             1340 ml   Filed Weights   02/04/16 1907  Weight: 44.4 kg (97 lb 14.2 oz)    Examination:  General exam:  Adult female, thin.  No acute distress.  Lying in bed HEENT:  NCAT, MMM Respiratory system: Clear to auscultation bilaterally Cardiovascular system: Regular rate and rhythm, normal S1/S2. No murmurs, rubs, gallops or clicks.  Warm extremities Gastrointestinal system: Hypoactive bowel sounds, soft, mildly distended,  minimally tender to palpation diffusely.  Incisions closed with Dermabond MSK:  Normal tone and bulk, no lower extremity edema Neuro:  Grossly intact   Data Reviewed: I have personally reviewed following labs and imaging studies  CBC:  Recent Labs Lab 02/07/16 0538 02/08/16 0428 02/09/16 0504 02/10/16 0515 02/11/16 0515  WBC 7.4 6.5 8.2 8.5 6.6  HGB 11.1* 12.3 11.8* 11.5* 10.9*  HCT 32.2* 35.4* 34.8* 34.5* 32.1*  MCV 101.6* 99.2 102.1* 102.4* 101.3*  PLT 172 190 188 222 [id number]   Basic Metabolic Panel:  Recent Labs Lab 02/07/16 0538 02/08/16 0428 02/09/16 0504 02/10/16 0515 02/11/16 0515  NA 140 139 139 139 140  K 3.9 4.2 4.0 4.5 4.4  CL 118* 116* 117* 116* 118*  CO2 16* 17* 15* 14* 17*  GLUCOSE 93 114* 124* 116* 102*  BUN 15 13 14 14 10   CREATININE 2.16* 1.86* 1.72* 1.90* 1.69*  CALCIUM 7.5* 7.8* 7.4* 7.1* 7.0*   Liver Function Tests:  Recent Labs Lab 02/07/16 0538 02/08/16 0428 02/09/16 0504 02/10/16 0515 02/11/16 0515  AST 27 22 17  421* 93*  ALT 12* 11* 10* 81* 60*  ALKPHOS 43 50 45 144* 128*  BILITOT 0.8 0.7 1.0 0.9 0.7  PROT 6.0* 6.4* 5.9* 5.8* 6.0*  ALBUMIN 2.8* 3.0* 2.7* 2.7* 2.8*    Recent Labs Lab 02/05/16 0428 02/06/16 0545 02/07/16 0538  LIPASE 1,679* 774* 289*   Cardiac Enzymes:  Recent Labs Lab 02/05/16 1625  TROPONINI <0.03   CBG:  Recent Labs Lab 02/07/16 0812 02/07/16 1135 02/07/16 1809 02/07/16 2135 02/08/16 0752  GLUCAP 100* 106* 101* 111* 106*   Urine analysis:    Component Value Date/Time   COLORURINE AMBER (A) 02/04/2016 1103   APPEARANCEUR TURBID (A) 02/04/2016 1103   LABSPEC 1.020 02/04/2016 1103   PHURINE 5.5 02/04/2016 1103   GLUCOSEU NEGATIVE 02/04/2016 1103   HGBUR MODERATE (A) 02/04/2016 1103   BILIRUBINUR LARGE (A) 02/04/2016 1103   KETONESUR 40 (A) 02/04/2016 1103   PROTEINUR 100 (A) 02/04/2016 1103   NITRITE NEGATIVE 02/04/2016 1103   LEUKOCYTESUR NEGATIVE 02/04/2016 1103   Recent Results (from  the past 240 hour(s))  Urine culture     Status: None   Collection Time: 02/04/16 11:03 AM  Result Value Ref Range Status   Specimen Description URINE, RANDOM  Final   Special Requests NONE  Final   Culture   Final    NO GROWTH 1 DAY Performed at [facility]    Report Status 02/05/2016 FINAL  Final  Blood culture (routine x 2)     Status: None   Collection Time: 02/04/16 11:25 AM  Result Value Ref Range Status   Specimen Description BLOOD RIGHT FOREARM  Final   Special Requests IN PEDIATRIC BOTTLE 3CC  Final   Culture   Final    NO GROWTH 5 DAYS Performed at [facility]    Report Status 02/09/2016 FINAL  Final  Blood culture (routine x 2)  Status: None   Collection Time: 02/04/16 12:50 PM  Result Value Ref Range Status   Specimen Description BLOOD RIGHT HAND  Final   Special Requests IN PEDIATRIC BOTTLE [place]  Final   Culture   Final    NO GROWTH 5 DAYS Performed at [facility]    Report Status 02/09/2016 FINAL  Final      Radiology Studies: No results found.   Scheduled Meds: . [MAR Hold] amLODipine  5 mg Oral Daily  . [MAR Hold] atorvastatin  40 mg Oral q1800  . ciprofloxacin  400 mg Intravenous Once  . [MAR Hold] enoxaparin (LOVENOX) injection  30 mg Subcutaneous QHS  . [MAR Hold] feeding supplement  1 Container Oral TID BM  . [MAR Hold] labetalol  100 mg Oral BID  . [MAR Hold]  morphine injection  4 mg Intravenous Once   Continuous Infusions: . sodium chloride    . dextrose 5 % and 0.45 % NaCl with KCl 20 mEq/L 100 mL/hr at 02/11/16 0600     LOS: 7 days   Time spent: 30 min  Faye Ramsay, MD [facility] Pager [phone]  If 7PM-7AM, please contact night-coverage [email or web address] Password [email or web address] 02/11/2016, 12:17 PM

## 2016-02-11 NOTE — Anesthesia Preprocedure Evaluation (Addendum)
Anesthesia Evaluation  Patient identified by MRN, date of birth, ID band Patient awake    Reviewed: Allergy & Precautions, NPO status , Patient's Chart, lab work & pertinent test results  Airway Mallampati: II  TM Distance: >3 FB Neck ROM: Full    Dental  (+) Loose   Pulmonary neg pulmonary ROS,    Pulmonary exam normal breath sounds clear to auscultation       Cardiovascular hypertension, Normal cardiovascular exam Rhythm:Regular Rate:Normal     Neuro/Psych negative neurological ROS  negative psych ROS   GI/Hepatic negative GI ROS, Neg liver ROS,   Endo/Other  negative endocrine ROS  Renal/GU Renal InsufficiencyRenal disease  negative genitourinary   Musculoskeletal negative musculoskeletal ROS (+)   Abdominal   Peds negative pediatric ROS (+)  Hematology negative hematology ROS (+)   Anesthesia Other Findings   Reproductive/Obstetrics negative OB ROS                            Anesthesia Physical Anesthesia Plan  ASA: II  Anesthesia Plan: General   Post-op Pain Management:    Induction: Intravenous  Airway Management Planned: Oral ETT  Additional Equipment:   Intra-op Plan:   Post-operative Plan: Extubation in OR  Informed Consent: I have reviewed the patients History and Physical, chart, labs and discussed the procedure including the risks, benefits and alternatives for the proposed anesthesia with the patient or authorized representative who has indicated his/her understanding and acceptance.   Dental advisory given  Plan Discussed with: CRNA  Anesthesia Plan Comments:         Anesthesia Quick Evaluation

## 2016-02-12 ENCOUNTER — Encounter (HOSPITAL_COMMUNITY): Payer: Self-pay | Admitting: Gastroenterology

## 2016-02-12 LAB — COMPREHENSIVE METABOLIC PANEL
ALBUMIN: 2.6 g/dL — AB (ref 3.5–5.0)
ALK PHOS: 104 U/L (ref 38–126)
ALT: 40 U/L (ref 14–54)
ANION GAP: 9 (ref 5–15)
AST: 46 U/L — ABNORMAL HIGH (ref 15–41)
BILIRUBIN TOTAL: 0.7 mg/dL (ref 0.3–1.2)
BUN: 7 mg/dL (ref 6–20)
CALCIUM: 6.8 mg/dL — AB (ref 8.9–10.3)
CO2: 15 mmol/L — ABNORMAL LOW (ref 22–32)
Chloride: 116 mmol/L — ABNORMAL HIGH (ref 101–111)
Creatinine, Ser: 1.49 mg/dL — ABNORMAL HIGH (ref 0.44–1.00)
GFR calc Af Amer: 38 mL/min — ABNORMAL LOW (ref >60)
GFR, EST NON AFRICAN AMERICAN: 33 mL/min — AB (ref >60)
GLUCOSE: 110 mg/dL — AB (ref 65–99)
Potassium: 4.6 mmol/L (ref 3.5–5.1)
Sodium: 140 mmol/L (ref 135–145)
TOTAL PROTEIN: 5.7 g/dL — AB (ref 6.5–8.1)

## 2016-02-12 LAB — CBC WITH DIFFERENTIAL/PLATELET
BASOS ABS: 0 10*3/uL (ref 0.0–0.1)
BASOS PCT: 0 %
EOS PCT: 0 %
Eosinophils Absolute: 0 10*3/uL (ref 0.0–0.7)
HCT: 31.1 % — ABNORMAL LOW (ref 36.0–46.0)
Hemoglobin: 10.6 g/dL — ABNORMAL LOW (ref 12.0–15.0)
Lymphocytes Relative: 17 %
Lymphs Abs: 1.4 10*3/uL (ref 0.7–4.0)
MCH: 34.8 pg — ABNORMAL HIGH (ref 26.0–34.0)
MCHC: 34.1 g/dL (ref 30.0–36.0)
MCV: 102 fL — ABNORMAL HIGH (ref 78.0–100.0)
MONO ABS: 1.2 10*3/uL — AB (ref 0.1–1.0)
Monocytes Relative: 15 %
NEUTROS ABS: 5.4 10*3/uL (ref 1.7–7.7)
Neutrophils Relative %: 68 %
PLATELETS: 241 10*3/uL (ref 150–400)
RBC: 3.05 MIL/uL — AB (ref 3.87–5.11)
RDW: 14.7 % (ref 11.5–15.5)
WBC: 8 10*3/uL (ref 4.0–10.5)

## 2016-02-12 MED ORDER — AMLODIPINE BESYLATE 5 MG PO TABS: 5.0000 mg | ORAL_TABLET | Freq: Every day | ORAL | 0 refills | Status: DC

## 2016-02-12 MED ORDER — LABETALOL HCL 100 MG PO TABS: 100.0000 mg | ORAL_TABLET | Freq: Two times a day (BID) | ORAL | 0 refills | Status: DC

## 2016-02-12 MED ORDER — ATORVASTATIN CALCIUM 40 MG PO TABS: 40.0000 mg | ORAL_TABLET | Freq: Every day | ORAL | 0 refills | Status: DC

## 2016-02-12 MED ORDER — HYDROCODONE-ACETAMINOPHEN 5-325 MG PO TABS: 1.0000 | ORAL_TABLET | ORAL | 0 refills | Status: DC | PRN

## 2016-02-12 NOTE — Progress Notes (Signed)
Patient ID: Kristine Shaffer, female   DOB: February 06, 1941, 75 y.o.   MRN: [id number]  [facility] Surgery Progress Note  1 Day Post-Op  Subjective: Tolerating clears. Denies any abdominal pain, nausea, vomiting.   Objective: Vital signs in last 24 hours: Temp:  [97.5 F (36.4 C)-98.2 F (36.8 C)] 98.2 F (36.8 C) (10/27 0423) Pulse Rate:  [75-83] 83 (10/27 0423) Resp:  [14-21] 18 (10/27 0423) BP: (153-199)/(55-76) 153/55 (10/27 0423) SpO2:  [96 %-100 %] 100 % (10/27 0423) Last BM Date: 02/08/16  Intake/Output from previous day: 10/26 0701 - 10/27 0700 In: 3220 [P.O.:120; I.V.:3100] Out: 1400 [Urine:1400] Intake/Output this shift: No intake/output data recorded.  PE: Gen:  Alert, NAD, pleasant Pulm:  Effort normal Abd: Soft, NT/ND, +BS, incisions C/D/I  Lab Results:   Recent Labs  02/11/16 0515 02/12/16 0529  WBC 6.6 8.0  HGB 10.9* 10.6*  HCT 32.1* 31.1*  PLT 219 241   BMET  Recent Labs  02/11/16 0515 02/12/16 0529  NA 140 140  K 4.4 4.6  CL 118* 116*  CO2 17* 15*  GLUCOSE 102* 110*  BUN 10 7  CREATININE 1.69* 1.49*  CALCIUM 7.0* 6.8*   PT/INR No results for input(s): LABPROT, INR in the last 72 hours. CMP     Component Value Date/Time   NA 140 02/12/2016 0529   K 4.6 02/12/2016 0529   CL 116 (H) 02/12/2016 0529   CO2 15 (L) 02/12/2016 0529   GLUCOSE 110 (H) 02/12/2016 0529   BUN 7 02/12/2016 0529   CREATININE 1.49 (H) 02/12/2016 0529   CALCIUM 6.8 (L) 02/12/2016 0529   PROT 5.7 (L) 02/12/2016 0529   ALBUMIN 2.6 (L) 02/12/2016 0529   AST 46 (H) 02/12/2016 0529   ALT 40 02/12/2016 0529   ALKPHOS 104 02/12/2016 0529   BILITOT 0.7 02/12/2016 0529   GFRNONAA 33 (L) 02/12/2016 0529   GFRAA 38 (L) 02/12/2016 0529   Lipase     Component Value Date/Time   LIPASE 289 (H) 02/07/2016 0538       Studies/Results: Dg Ercp Biliary & Pancreatic Ducts  Result Date: 02/11/2016 CLINICAL DATA:  Cholecystectomy EXAM: ERCP TECHNIQUE: Multiple spot  images obtained with the fluoroscopic device and submitted for interpretation post-procedure. FLUOROSCOPY TIME:  Fluoroscopy Time:  8 minutes and 38 seconds Radiation Exposure Index (if provided by the fluoroscopic device): Not applicable Number of Acquired Spot Images: 20 COMPARISON:  None. FINDINGS: There is long segment narrowing of the distal common bile duct. Malignancy is not excluded. There is mild dilatation of the upper common bile duct. IMPRESSION: See above. These images were submitted for radiologic interpretation only. Please see the procedural report for the amount of contrast and the fluoroscopy time utilized. Electronically Signed   By: Marybelle Killings M.D.   On: 02/11/2016 14:51    Anti-infectives: Anti-infectives    Start     Dose/Rate Route Frequency Ordered Stop   02/11/16 1200  ciprofloxacin (CIPRO) IVPB 400 mg     400 mg 200 mL/hr over 60 Minutes Intravenous  Once 02/11/16 1148 02/11/16 1320   02/09/16 0800  cefoTEtan (CEFOTAN) 2 g in dextrose 5 % 50 mL IVPB     2 g 100 mL/hr over 30 Minutes Intravenous  Once 02/09/16 0745 02/09/16 1037   02/04/16 1345  piperacillin-tazobactam (ZOSYN) IVPB 3.375 g     3.375 g 100 mL/hr over 30 Minutes Intravenous  Once 02/04/16 1341 02/04/16 1450       Assessment/Plan s/p Procedure(s): LAPAROSCOPIC  CHOLECYSTECTOMY WITH INTRAOPERATIVE CHOLANGIOGRAM 02/09/2016 [place] - POD 3 - ERCP yesterday showed no stones - tolerating clears, pain controlled  Plan - Soft diet, advance as tolerated. Ready for discharge from a surgery standpoint. Follow-up with Dr. Kieth Brightly in 3 weeks.    LOS: 8 days    Jerrye Beavers , [facility] Surgery 02/12/2016, 10:17 AM Pager: [phone] Consults: [phone] Mon-Fri 7:00 am-4:30 pm Sat-Sun 7:00 am-11:30 am

## 2016-02-12 NOTE — Progress Notes (Signed)
Discharge instructions discussed with family and patient, verbalized agreement and understanding, prescriptions given to patient

## 2016-02-12 NOTE — Discharge Summary (Signed)
Physician Discharge Summary  Kristine Shaffer [id number] DOB: Aug 11, 1940 DOA: 02/04/2016  PCP: No primary care provider on file.  Admit date: 02/04/2016 Discharge date: 02/12/2016  Recommendations for Outpatient Follow-up:  1. Pt will need to follow up with PCP in 2-3 weeks post discharge 2. Please obtain BMP to evaluate electrolytes and kidney function 3. Please also check CBC to evaluate Hg and Hct levels  Discharge Diagnoses:  Principal Problem:   Acute pancreatitis Active Problems:   Renal failure   Lactic acidosis   Benign essential HTN  Discharge Condition: Stable  Diet recommendation: Heart healthy diet discussed in details   Brief Narrative:  75 y.o.femalewith who has not seen a doctor in a long time and has no past medical history who presented with poor appetite for many years and intermittent abdominal pain for the last month.  On the day of admission, she developed nausea and nonbloody emesis.  In the ER, [id number], CT abdomen and pelvis demonstrated acute pancreatitis, cholelithiasis.  Incidentally, she was very hypertensive, her creatinine was elevated at 2.88 with unknown baseline, and her CT showed that her vagina appeared distended with possible fecal contents.  She was admitted and placed on IV fluids. Her lactic acid resolved. Her abdominal pain gradually improved and her lipase trended down. Her creatinine also improved. She underwent microscopic cholecystectomy on 10/24. She had what appeared to be some small nonobstructive gallstones in the nondilated common bile duct on intraoperative cholangiogram. Plan to repeat LFTs in the morning. If stable, and patient asymptomatic, will anticipate discharge on Wednesday.  Assessment & Plan: Acute pancreatitis, likely gallstone pancreatitis - abdominal pain resolved but having intermittent bilious emesis s/p cholecystectomy on 10/24 - IOC demonstrated some luminal irregularities in the nondilated CBD suggestive of  some retained stones.  - Patient has been asymptomatic over the last few days but LFT's still elevated  - patient underwent ERCP yesterday. 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 Sphincterotomy was performed.  - Doing better today. Denied abdominal pain. Denied nausea or vomiting. - diet advanced and pt tolerating well, wants to go home   Acute kidney injury vs. Chronic kidney disease, stage III - with nongap metabolic acidosis, creatinine better this AM, trending down from 1.9 --> 1.69 - Minimize nephrotoxins and renally dose medications - Nephrology follow-up outpatient   Essential hypertension - resume home medical regimen   ECG changes and coronary calcifications seen on CT. Diffuse ST segment depressions now resolved - Troponin negative - ECHO with grade 2 DD and severe LVH  - Started statin, LDL 120  Short term memory loss and probable dementia - B12 borderline low, RPR NR - b12 injection 10/22  Possible stool in vagina on CT - No stool visible on external vaginal exam performed on 10/23. - GYN referral at discharge  Hyperglycemia, likely due to stress, A1c 4.4.  D/c SSI Hypokalemia, resolved with oral and IV potassium repletion Leukocytosis, resolved Tachycardia, resolved with IVF  DVT prophylaxis:  lovenox Code Status:  full Family Communication:  Pt at bedside, niece at the bedside  Disposition Plan:  Home   Consultants:   General surgery  GI  Antimicrobials:   None   Procedures/Studies: Ct Abdomen Pelvis Wo Contrast  Result Date: 02/04/2016 CLINICAL DATA:  75 year old female with recent history of abdominal pain, with some vomiting and diarrhea yesterday. Lethargy and disorientation. EXAM: CT ABDOMEN AND PELVIS WITHOUT CONTRAST TECHNIQUE: Multidetector CT imaging of the abdomen and pelvis was performed following the standard protocol without  IV contrast. COMPARISON:  No priors. FINDINGS: Lower chest: Atherosclerotic calcifications  in the left circumflex and right coronary arteries. Calcifications of the mitral annulus and the aortic root. Mild scarring throughout the visualize lung bases. Hepatobiliary: No definite cystic or solid hepatic lesions identified on today's noncontrast CT examination. Tiny calcified granuloma in the right lobe of the liver. Numerous calcified gallstones lying dependently in the gallbladder. No findings to suggest an acute cholecystitis at this time. Pancreas: No discrete pancreatic mass confidently identified on today's noncontrast CT examination. However, there are extensive inflammatory changes noted throughout the retroperitoneum adjacent to the spleen, concerning for an acute pancreatitis. No well-defined peripancreatic fluid collection is noted to strongly suggest a pancreatic pseudocyst at this time. Spleen: Unremarkable. Adrenals/Urinary Tract: Unenhanced appearance of the kidneys and bilateral adrenal glands is unremarkable. No hydroureteronephrosis. Urinary bladder is normal in appearance. Stomach/Bowel: Unenhanced appearance of the stomach is normal. There is no pathologic dilatation of small bowel or colon. Normal appendix. Rectum is moderately distended with well-formed stool, exerting mass effect upon adjacent structures. Vascular/Lymphatic: Aortic atherosclerosis, without definite aneurysm in the abdominal or pelvic vasculature. No definite lymphadenopathy is noted in the abdomen or pelvis. Reproductive: Uterus and ovaries are atrophic. Importantly, the vagina appears partially distended by what appears to be feculent contents, raising concern for potential rectovaginal fistula. Other: No significant volume of ascites.  No pneumoperitoneum. Musculoskeletal: There are no aggressive appearing lytic or blastic lesions noted in the visualized portions of the skeleton. IMPRESSION: 1. Imaging findings are concerning for an acute pancreatitis. 2. Cholelithiasis without evidence to suggest an acute  cholecystitis at this time. 3. The vagina appears partially distended with what appears to be feculent contents. This raises concern for potential rectovaginal fistula. Clinical correlation is suggested. 4. Aortic atherosclerosis, in addition to at least 2 vessel coronary artery disease. Assessment for potential risk factor modification, dietary therapy or pharmacologic therapy may be warranted, if clinically indicated. 5. Additional incidental findings, as above. Electronically Signed   By: Vinnie Langton M.D.   On: 02/04/2016 15:00   Dg Chest 2 View  Result Date: 02/04/2016 CLINICAL DATA:  tachycardia, disoriented EXAM: CHEST  2 VIEW COMPARISON:  None. FINDINGS: Cardiomediastinal silhouette is unremarkable. No acute infiltrate or pleural effusion. No pulmonary edema. Mild degenerative changes thoracic spine. IMPRESSION: No active cardiopulmonary disease. Electronically Signed   By: Lahoma Crocker M.D.   On: 02/04/2016 12:32   Dg Cholangiogram Operative  Result Date: 02/09/2016 CLINICAL DATA:  Intraoperative cholangiogram during laparoscopic cholecystectomy. EXAM: INTRAOPERATIVE CHOLANGIOGRAM FLUOROSCOPY TIME:  40 seconds (100 mGy) COMPARISON:  CT abdomen and pelvis - 02/04/2016 FINDINGS: Intraoperative cholangiographic images of the right upper abdominal quadrant during laparoscopic cholecystectomy are provided for review. Contrast injection demonstrates opacification of the gallbladder. There are multiple persistent filling defects within the gallbladder lumen compatible with known extensive cholelithiasis. There is passage of contrast through the central aspect of the cystic duct with filling of a non dilated common bile duct. There is passage of contrast though the CBD and into the descending portion of the duodenum. There is minimal reflux of injected contrast into the common hepatic duct and central aspect of the non dilated intrahepatic biliary system. There are several persistent ill-defined  nonocclusive filling defects within the distal aspect of the non dilated CBD worrisome for choledocholithiasis. IMPRESSION: Cholelithiasis with findings worrisome for choledocholithiasis. Correlation with the operative report is recommended. Electronically Signed   By: Sandi Mariscal M.D.   On: 02/09/2016 12:28   Dg Ercp  Biliary & Pancreatic Ducts  Result Date: 02/11/2016 CLINICAL DATA:  Cholecystectomy EXAM: ERCP TECHNIQUE: Multiple spot images obtained with the fluoroscopic device and submitted for interpretation post-procedure. FLUOROSCOPY TIME:  Fluoroscopy Time:  8 minutes and 38 seconds Radiation Exposure Index (if provided by the fluoroscopic device): Not applicable Number of Acquired Spot Images: 20 COMPARISON:  None. FINDINGS: There is long segment narrowing of the distal common bile duct. Malignancy is not excluded. There is mild dilatation of the upper common bile duct. IMPRESSION: See above. These images were submitted for radiologic interpretation only. Please see the procedural report for the amount of contrast and the fluoroscopy time utilized. Electronically Signed   By: Marybelle Killings M.D.   On: 02/11/2016 14:51     Discharge Exam: Vitals:   02/11/16 2052 02/12/16 0423  BP: (!) 165/55 (!) 153/55  Pulse: 77 83  Resp: 18 18  Temp: 97.5 F (36.4 C) 98.2 F (36.8 C)   Vitals:   02/11/16 1330 02/11/16 1428 02/11/16 2052 02/12/16 0423  BP: (!) 193/72 (!) 158/73 (!) 165/55 (!) 153/55  Pulse:  75 77 83  Resp:  19 18 18   Temp:  97.5 F (36.4 C) 97.5 F (36.4 C) 98.2 F (36.8 C)  TempSrc:  Oral Oral Oral  SpO2:  97% 100% 100%  Weight:      Height:        General: Pt is alert, follows commands appropriately, not in acute distress Cardiovascular: Regular rate and rhythm, S1/S2 +, no murmurs, no rubs, no gallops Respiratory: Clear to auscultation bilaterally, no wheezing, no crackles, no rhonchi Abdominal: Soft, non tender, non distended, bowel sounds +, no guarding  Discharge  Instructions  Discharge Instructions    Diet - low sodium heart healthy    Complete by:  As directed    Increase activity slowly    Complete by:  As directed        Medication List    TAKE these medications   amLODipine 5 MG tablet Commonly known as:  NORVASC Take 1 tablet (5 mg total) by mouth daily. Start taking on:  02/13/2016   atorvastatin 40 MG tablet Commonly known as:  LIPITOR Take 1 tablet (40 mg total) by mouth daily at 6 PM.   cholecalciferol 1000 units tablet Commonly known as:  VITAMIN D Take 1,000 Units by mouth daily.   ferrous sulfate 325 (65 FE) MG tablet Take 325 mg by mouth daily with breakfast.   HYDROcodone-acetaminophen 5-325 MG tablet Commonly known as:  NORCO/VICODIN Take 1 tablet by mouth every 4 (four) hours as needed for moderate pain.   labetalol 100 MG tablet Commonly known as:  NORMODYNE Take 1 tablet (100 mg total) by mouth 2 (two) times daily.   multivitamin with minerals Tabs tablet Take 1 tablet by mouth daily.      Follow-up Information    Mickeal Skinner, MD. Call on 02/26/2016.   Specialty:  General Surgery Why:  Your appointment is 02/26/2016 at 10:45am. Please arrive 30 minutes prior to your appointment time to fill out necessary paperwork. Contact information: [address] [place] [address] [phone]        Faye Ramsay, MD .   Specialty:  Internal Medicine Why:  call me with questions [phone] Contact information: [address] [place] [address] [phone]            The results of significant diagnostics from this hospitalization (including imaging, microbiology, ancillary and laboratory)  are listed below for reference.     Microbiology: Recent Results (from the past 240 hour(s))  Urine culture     Status: None   Collection Time: 02/04/16 11:03 AM  Result Value Ref Range Status   Specimen Description URINE, RANDOM  Final   Special Requests  NONE  Final   Culture   Final    NO GROWTH 1 DAY Performed at [facility]    Report Status 02/05/2016 FINAL  Final  Blood culture (routine x 2)     Status: None   Collection Time: 02/04/16 11:25 AM  Result Value Ref Range Status   Specimen Description BLOOD RIGHT FOREARM  Final   Special Requests IN PEDIATRIC BOTTLE 3CC  Final   Culture   Final    NO GROWTH 5 DAYS Performed at [facility]    Report Status 02/09/2016 FINAL  Final  Blood culture (routine x 2)     Status: None   Collection Time: 02/04/16 12:50 PM  Result Value Ref Range Status   Specimen Description BLOOD RIGHT HAND  Final   Special Requests IN PEDIATRIC BOTTLE [facility]  Final   Culture   Final    NO GROWTH 5 DAYS Performed at [facility]    Report Status 02/09/2016 FINAL  Final     Labs: Basic Metabolic Panel:  Recent Labs Lab 02/08/16 0428 02/09/16 0504 02/10/16 0515 02/11/16 0515 02/12/16 0529  NA 139 139 139 140 140  K 4.2 4.0 4.5 4.4 4.6  CL 116* 117* 116* 118* 116*  CO2 17* 15* 14* 17* 15*  GLUCOSE 114* 124* 116* 102* 110*  BUN 13 14 14 10 7   CREATININE 1.86* 1.72* 1.90* 1.69* 1.49*  CALCIUM 7.8* 7.4* 7.1* 7.0* 6.8*   Liver Function Tests:  Recent Labs Lab 02/08/16 0428 02/09/16 0504 02/10/16 0515 02/11/16 0515 02/12/16 0529  AST 22 17 421* 93* 46*  ALT 11* 10* 81* 60* 40  ALKPHOS 50 45 144* 128* 104  BILITOT 0.7 1.0 0.9 0.7 0.7  PROT 6.4* 5.9* 5.8* 6.0* 5.7*  ALBUMIN 3.0* 2.7* 2.7* 2.8* 2.6*    Recent Labs Lab 02/06/16 0545 02/07/16 0538  LIPASE 774* 289*   CBC:  Recent Labs Lab 02/08/16 0428 02/09/16 0504 02/10/16 0515 02/11/16 0515 02/12/16 0529  WBC 6.5 8.2 8.5 6.6 8.0  NEUTROABS  --   --   --   --  5.4  HGB 12.3 11.8* 11.5* 10.9* 10.6*  HCT 35.4* 34.8* 34.5* 32.1* 31.1*  MCV 99.2 102.1* 102.4* 101.3* 102.0*  PLT 190 188 222 219 241   Cardiac Enzymes:  Recent Labs Lab 02/05/16 1625  TROPONINI <0.03   CBG:  Recent Labs Lab  02/07/16 0812 02/07/16 1135 02/07/16 1809 02/07/16 2135 02/08/16 0752  GLUCAP 100* 106* 101* 111* 106*     SIGNED: Time coordinating discharge:  30 minutes  MAGICK-Emonie Espericueta, MD  [facility] 02/12/2016, 12:17 PM Pager [phone]  If 7PM-7AM, please contact night-coverage [email or web address] Password [email or web address]

## 2016-02-12 NOTE — Care Management Important Message (Signed)
Important Message  Patient Details  Name: Kristine Shaffer MRN: [id number] Date of Birth: 02-23-1941   Medicare Important Message Given:  Yes    Camillo Flaming 02/12/2016, 9:00 AMImportant Message  Patient Details  Name: Kristine Shaffer MRN: [id number] Date of Birth: 1940-05-22   Medicare Important Message Given:  Yes    Camillo Flaming 02/12/2016, 9:00 AM

## 2016-02-12 NOTE — Progress Notes (Signed)
[facility] Gastroenterology Progress Note  Trannie Deeb 75 y.o. June 08, 1940  CC:   Follow-up status post ERCP. Abnormal LFTs.   Subjective: Patient underwent ERCP yesterday. 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 Sphincterotomy was performed. Doing better today. Denied abdominal pain. Denied nausea or vomiting.  ROS : Negative for abdominal pain, nausea, vomiting   Objective: Vital signs in last 24 hours: Vitals:   02/11/16 2052 02/12/16 0423  BP: (!) 165/55 (!) 153/55  Pulse: 77 83  Resp: 18 18  Temp: 97.5 F (36.4 C) 98.2 F (36.8 C)    Physical Exam:  General:  Alert, cooperative, no distress,   Head:  Normocephalic, without obvious abnormality, atraumatic  Eyes:  , EOM's intact,   Lungs:   Clear to auscultation bilaterally, respirations unlabored  Heart:  Regular rate and rhythm, S1, S2 normal  Abdomen:   Soft, non-tender, bowel sounds active all four quadrants,  no masses,   Extremities: Extremities normal, atraumatic, no  edema       Lab Results:  Recent Labs  02/11/16 0515 02/12/16 0529  NA 140 140  K 4.4 4.6  CL 118* 116*  CO2 17* 15*  GLUCOSE 102* 110*  BUN 10 7  CREATININE 1.69* 1.49*  CALCIUM 7.0* 6.8*    Recent Labs  02/11/16 0515 02/12/16 0529  AST 93* 46*  ALT 60* 40  ALKPHOS 128* 104  BILITOT 0.7 0.7  PROT 6.0* 5.7*  ALBUMIN 2.8* 2.6*    Recent Labs  02/11/16 0515 02/12/16 0529  WBC 6.6 8.0  NEUTROABS  --  5.4  HGB 10.9* 10.6*  HCT 32.1* 31.1*  MCV 101.3* 102.0*  PLT 219 241   No results for input(s): LABPROT, INR in the last 72 hours.    Assessment/Plan: - Gallstone pancreatitis status post laparoscopic cholecystectomy with abnormal IOC. ERCP yesterday showed benign-appearing distal CBD stricture which was dilated. Patient also had a sphincterotomy performed. - Abnormal LFTs. Improving - HTN   Recommendations ------------------------- - Patient doing better. Advance diet as tolerated. - GI will sign off. -  Call us back if needed.  Otis Brace MD, FACP 02/12/2016, 8:28 AM  Pager [phone]  If no answer or after 5 PM call [phone]

## 2016-02-12 NOTE — Progress Notes (Signed)
Patient removed IV to right and left forearm

## 2016-03-16 DIAGNOSIS — R5383 Other fatigue: Secondary | ICD-10-CM | POA: Diagnosis not present

## 2016-03-16 DIAGNOSIS — R634 Abnormal weight loss: Secondary | ICD-10-CM | POA: Diagnosis not present

## 2016-03-16 DIAGNOSIS — E782 Mixed hyperlipidemia: Secondary | ICD-10-CM | POA: Diagnosis not present

## 2016-03-16 DIAGNOSIS — I10 Essential (primary) hypertension: Secondary | ICD-10-CM | POA: Diagnosis not present

## 2016-07-13 ENCOUNTER — Ambulatory Visit (INDEPENDENT_AMBULATORY_CARE_PROVIDER_SITE_OTHER): Payer: Medicare Other | Admitting: Internal Medicine

## 2016-07-13 ENCOUNTER — Encounter: Payer: Self-pay | Admitting: Internal Medicine

## 2016-07-13 VITALS — BP 108/58 | HR 75 | Temp 97.7°F | Wt 99.4 lb

## 2016-07-13 DIAGNOSIS — D508 Other iron deficiency anemias: Secondary | ICD-10-CM | POA: Diagnosis not present

## 2016-07-13 DIAGNOSIS — I10 Essential (primary) hypertension: Secondary | ICD-10-CM

## 2016-07-13 DIAGNOSIS — E785 Hyperlipidemia, unspecified: Secondary | ICD-10-CM

## 2016-07-13 DIAGNOSIS — Z0000 Encounter for general adult medical examination without abnormal findings: Secondary | ICD-10-CM | POA: Insufficient documentation

## 2016-07-13 DIAGNOSIS — R739 Hyperglycemia, unspecified: Secondary | ICD-10-CM

## 2016-07-13 DIAGNOSIS — E46 Unspecified protein-calorie malnutrition: Secondary | ICD-10-CM | POA: Diagnosis not present

## 2016-07-13 LAB — POCT GLYCOSYLATED HEMOGLOBIN (HGB A1C): HEMOGLOBIN A1C: 4.4

## 2016-07-13 MED ORDER — LABETALOL HCL 200 MG PO TABS: 200.0000 mg | ORAL_TABLET | Freq: Two times a day (BID) | ORAL | 3 refills | Status: DC

## 2016-07-13 MED ORDER — VITAMIN D3 25 MCG (1000 UNIT) PO TABS: 1000.0000 [IU] | ORAL_TABLET | Freq: Every day | ORAL | 3 refills | Status: DC

## 2016-07-13 MED ORDER — ATORVASTATIN CALCIUM 80 MG PO TABS: 80.0000 mg | ORAL_TABLET | Freq: Every day | ORAL | 3 refills | Status: DC

## 2016-07-13 MED ORDER — FERROUS SULFATE 325 (65 FE) MG PO TABS: 325.0000 mg | ORAL_TABLET | Freq: Every day | ORAL | 3 refills | Status: DC

## 2016-07-13 NOTE — Patient Instructions (Addendum)
It was nice meeting you today Kristine Shaffer!  Please take the following medications: - Labetalol (for high blood pressure ) 200mg  two times a day - Atorvastatin (for high cholesterol) 80mg  once a day - Iron supplement once a day - Vitamin D supplement once a day - Multivitamin once a day  It is also very important to make sure you are getting enough nutrients. We talked today about trying to drink at least two shakes every day (one in the morning, and one later in the day).   I will see you back in one month to go over your lab results and to see how you are doing with the shakes.   If you have any questions or concerns, please feel free to call the clinic.   Be well,  Dr. Avon Gully

## 2016-07-13 NOTE — Assessment & Plan Note (Signed)
BP slightly low at 108/58 in office today. Patient not reporting dizziness or other symptoms. Will continue current BP med regimen, though if still low at f/u will likely decrease labetalol to 100mg  BID.  - Refilled labetalol 200mg  BID - BMP today to monitor Cr, especially given history of elevated Cr during hospital admission

## 2016-07-13 NOTE — Assessment & Plan Note (Signed)
Refilled atorvastatin 80mg  qd today.  Lipid panel obtained today.

## 2016-07-13 NOTE — Progress Notes (Signed)
76 y.o. year old female presents to establish care.  Acute Concerns: Patient presents with her niece today. Per niece, patient had not been to any doctor for as long as she could remember before 01/2016. Patient presented to ED at that time for intermittent abdominal pain x1 month and was subsequently admitted for treatment of acute pancreatitis, likely gallstone pancreatitis. She received cholecystectomy and ERCP while admitted, with subsequent improvement in pain. Patient also found to have AKI vs CKD stage III, and HTN. Had one elevated CBG while admitted but A1C was WNL. Patient was started on atorvastatin, labetalol, and amlodipine during admission.   Since discharge, she followed up with general surgery as recommended, and was told she did not need any additional follow up. She has not followed up with nephrology for recommended. She has been seen by Kathlene November FNP [facility] in [place]. Niece forgot to bring patient's med list with her, but says she is pretty sure FNP stopped amlodipine as patient was feeling dizzy, and increased doses of both labetalol and atorvastatin. Patient was also prescribed Prozac for appetite stimulation, but niece never gave it to patient. Patient has gained about six pounds, but niece is still concerned that she does not eat enough (see more below in "Diet"). She was also prescribed iron and Vit D supplementation which she has not been taking. Has been taking daily multivitamin.   Diet: Patient only eats Oodles of Noodles maybe once a day. She said she does not eat because she is not hungry, and when she feels hungry all she wants to eat is Oodles of Noodles. It was recommended that she begin meal supplementation with shakes when she was admitted in 01/2016. Niece has been making sure patient has shakes in the house, but she will only drink them occasionally. Niece has been trying to get patient to drink at least one a day, but  this has been difficult. Patient says she does not mind the taste of the chocolate shakes. Patient also says that she has always been very small and has never had much of an appetite, but niece says patient has weighed more than her current weight in the past.   Exercise: Walks 1-2 times per week for about 30 minutes.   Sexual/Birth History: G0P0   Social:  Social History   Social History  . Marital status: Single    Spouse name: N/A  . Number of children: N/A  . Years of education: N/A   Social History Main Topics  . Smoking status: Never Smoker  . Smokeless tobacco: Never Used  . Alcohol use No  . Drug use: No  . Sexual activity: No   Other Topics Concern  . None   Social History Narrative  . None   No Known Allergies Past Surgical History:  Procedure Laterality Date  . CHOLECYSTECTOMY N/A 02/09/2016   Procedure: LAPAROSCOPIC CHOLECYSTECTOMY WITH INTRAOPERATIVE CHOLANGIOGRAM;  Surgeon: Arta Bruce Kinsinger, MD;  Location: [facility] ORS;  Service: General;  Laterality: N/A;  . ERCP N/A 02/11/2016   Procedure: ENDOSCOPIC RETROGRADE CHOLANGIOPANCREATOGRAPHY (ERCP);  Surgeon: Clarene Essex, MD;  Location: [facility];  Service: Endoscopy;  Laterality: N/A;   Past Medical History:  Diagnosis Date  . Acute gallstone pancreatitis   . Benign essential HTN   . HLD (hyperlipidemia)     Immunization:  There is no immunization history on file for this patient.  Cancer Screening:  Pap Smear: Never performed  Mammogram: Never performed   Colonoscopy: Never  performed  Dexa: Never performed  Physical Exam: VITALS: Reviewed GEN: Very thin, pleasant female, NAD HEENT: Normocephalic, PERRL, EOMI, no scleral icterus, bilateral TM pearly grey, nasal septum midline, MMM, uvula midline, no anterior or posterior lymphadenopathy, no thyromegaly CARDIAC:RRR, S1 and S2 present, no murmur, no heaves/thrills RESP: CTAB, normal effort ABD: Soft, no tenderness, normal bowel sounds EXT: 5/5  strength upper and lower extremities bilaterally, no LE edema SKIN: no rash NEURO: CN II-XII grossly intact, A&Ox3 PSYCH: Appropriate mood and affect  ASSESSMENT & PLAN: 76 y.o. female presents to establish care. Please see problem specific assessment and plan.   Benign essential HTN BP slightly low at 108/58 in office today. Patient not reporting dizziness or other symptoms. Will continue current BP med regimen, though if still low at f/u will likely decrease labetalol to 100mg  BID.  - Refilled labetalol 200mg  BID - BMP today to monitor Cr, especially given history of elevated Cr during hospital admission  HLD (hyperlipidemia) Refilled atorvastatin 80mg  qd today.  Lipid panel obtained today.   Healthcare maintenance - BMP for Cr monitoring given HTN and history of AKI vs CKD - CBC given history of iron-deficiency anemia and patient non-compliant with iron supplementation - A1C given elevated CBG during admission - Lipid panel - Patient not amenable to cancer screening today. Will continue discussion at follow up visits.  - F/u in one month   Malnourished [facility]) Patient with weight up 6 pounds from hospital admission in 01/2016, but still only at 99lbs. Only eating Oodles of Noodles maybe once per day, and not compliant with shake supplementation. Discussed importance of receiving appropriate nutrients for staying healthy, and that she is not getting this from her current food intake. Patient agreeable to increase shake supplementation, and said she will try to drink two shakes per day (one in AM and one later in the day when she eats).  - BMP today - Weight check and f/u in one month. Goal to increase shake supplementation to TID at that time and attempt to get patient to add other foods into her diet.   Adin Hector, MD, MPH PGY-2 [facility] Pager [phone]

## 2016-07-13 NOTE — Assessment & Plan Note (Signed)
-   BMP for Cr monitoring given HTN and history of AKI vs CKD - CBC given history of iron-deficiency anemia and patient non-compliant with iron supplementation - A1C given elevated CBG during admission - Lipid panel - Patient not amenable to cancer screening today. Will continue discussion at follow up visits.  - F/u in one month

## 2016-07-13 NOTE — Assessment & Plan Note (Signed)
Patient with weight up 6 pounds from hospital admission in 01/2016, but still only at 99lbs. Only eating Oodles of Noodles maybe once per day, and not compliant with shake supplementation. Discussed importance of receiving appropriate nutrients for staying healthy, and that she is not getting this from her current food intake. Patient agreeable to increase shake supplementation, and said she will try to drink two shakes per day (one in AM and one later in the day when she eats).  - BMP today - Weight check and f/u in one month. Goal to increase shake supplementation to TID at that time and attempt to get patient to add other foods into her diet.

## 2016-07-14 LAB — CBC
HEMATOCRIT: 38.2 % (ref 34.0–46.6)
Hemoglobin: 12.5 g/dL (ref 11.1–15.9)
MCH: 33.6 pg — AB (ref 26.6–33.0)
MCHC: 32.7 g/dL (ref 31.5–35.7)
MCV: 103 fL — AB (ref 79–97)
PLATELETS: 328 10*3/uL (ref 150–379)
RBC: 3.72 x10E6/uL — AB (ref 3.77–5.28)
RDW: 14 % (ref 12.3–15.4)
WBC: 5.2 10*3/uL (ref 3.4–10.8)

## 2016-07-14 LAB — BASIC METABOLIC PANEL
BUN / CREAT RATIO: 18 (ref 12–28)
BUN: 15 mg/dL (ref 8–27)
CALCIUM: 9.9 mg/dL (ref 8.7–10.3)
CO2: 20 mmol/L (ref 18–29)
Chloride: 100 mmol/L (ref 96–106)
Creatinine, Ser: 0.85 mg/dL (ref 0.57–1.00)
GFR calc non Af Amer: 67 mL/min/{1.73_m2} (ref >59)
GFR, EST AFRICAN AMERICAN: 77 mL/min/{1.73_m2} (ref >59)
GLUCOSE: 111 mg/dL — AB (ref 65–99)
Potassium: 4 mmol/L (ref 3.5–5.2)
SODIUM: 144 mmol/L (ref 134–144)

## 2016-07-14 LAB — LIPID PANEL
Chol/HDL Ratio: 1.9 ratio units (ref 0.0–4.4)
Cholesterol, Total: 275 mg/dL — ABNORMAL HIGH (ref 100–199)
HDL: 146 mg/dL (ref >39)
LDL Calculated: 110 mg/dL — ABNORMAL HIGH (ref 0–99)
Triglycerides: 93 mg/dL (ref 0–149)
VLDL Cholesterol Cal: 19 mg/dL (ref 5–40)

## 2016-08-13 ENCOUNTER — Observation Stay (HOSPITAL_COMMUNITY)
Admission: EM | Admit: 2016-08-13 | Discharge: 2016-08-14 | Disposition: A | Discharge status: Home / Self Care | Payer: Medicare Other | Attending: Family Medicine | Admitting: Family Medicine

## 2016-08-13 ENCOUNTER — Encounter (HOSPITAL_COMMUNITY): Payer: Self-pay | Admitting: Medical

## 2016-08-13 ENCOUNTER — Emergency Department (HOSPITAL_COMMUNITY): Payer: Medicare Other

## 2016-08-13 ENCOUNTER — Observation Stay (HOSPITAL_COMMUNITY): Payer: Medicare Other

## 2016-08-13 DIAGNOSIS — F1010 Alcohol abuse, uncomplicated: Secondary | ICD-10-CM | POA: Insufficient documentation

## 2016-08-13 DIAGNOSIS — R531 Weakness: Secondary | ICD-10-CM | POA: Diagnosis not present

## 2016-08-13 DIAGNOSIS — R740 Nonspecific elevation of levels of transaminase and lactic acid dehydrogenase [LDH]: Secondary | ICD-10-CM | POA: Diagnosis not present

## 2016-08-13 DIAGNOSIS — R4182 Altered mental status, unspecified: Secondary | ICD-10-CM | POA: Diagnosis present

## 2016-08-13 DIAGNOSIS — R630 Anorexia: Secondary | ICD-10-CM | POA: Diagnosis not present

## 2016-08-13 DIAGNOSIS — Z79899 Other long term (current) drug therapy: Secondary | ICD-10-CM | POA: Diagnosis not present

## 2016-08-13 DIAGNOSIS — R251 Tremor, unspecified: Secondary | ICD-10-CM | POA: Insufficient documentation

## 2016-08-13 DIAGNOSIS — E161 Other hypoglycemia: Secondary | ICD-10-CM | POA: Diagnosis not present

## 2016-08-13 DIAGNOSIS — E162 Hypoglycemia, unspecified: Principal | ICD-10-CM | POA: Insufficient documentation

## 2016-08-13 DIAGNOSIS — R7989 Other specified abnormal findings of blood chemistry: Secondary | ICD-10-CM

## 2016-08-13 DIAGNOSIS — I6789 Other cerebrovascular disease: Secondary | ICD-10-CM | POA: Diagnosis not present

## 2016-08-13 HISTORY — DX: Acute pancreatitis without necrosis or infection, unspecified: K85.90

## 2016-08-13 HISTORY — DX: Altered mental status, unspecified: R41.82

## 2016-08-13 LAB — LACTIC ACID, PLASMA
Lactic Acid, Venous: 2.7 mmol/L (ref 0.5–1.9)
Lactic Acid, Venous: 1.9 mmol/L (ref 0.5–1.9)

## 2016-08-13 LAB — I-STAT TROPONIN, ED: Troponin i, poc: 0 ng/mL (ref 0.00–0.08)

## 2016-08-13 LAB — COMPREHENSIVE METABOLIC PANEL
ALK PHOS: 73 U/L (ref 38–126)
ALT: 19 U/L (ref 14–54)
ANION GAP: 21 — AB (ref 5–15)
AST: 40 U/L (ref 15–41)
Albumin: 4 g/dL (ref 3.5–5.0)
BILIRUBIN TOTAL: 1 mg/dL (ref 0.3–1.2)
BUN: 19 mg/dL (ref 6–20)
CALCIUM: 9.3 mg/dL (ref 8.9–10.3)
CHLORIDE: 103 mmol/L (ref 101–111)
CO2: 15 mmol/L — AB (ref 22–32)
Creatinine, Ser: 1.08 mg/dL — ABNORMAL HIGH (ref 0.44–1.00)
GFR calc non Af Amer: 49 mL/min — ABNORMAL LOW (ref >60)
GFR, EST AFRICAN AMERICAN: 56 mL/min — AB (ref >60)
GLUCOSE: 62 mg/dL — AB (ref 65–99)
Potassium: 3.8 mmol/L (ref 3.5–5.1)
Sodium: 139 mmol/L (ref 135–145)
TOTAL PROTEIN: 7.7 g/dL (ref 6.5–8.1)

## 2016-08-13 LAB — CBC WITH DIFFERENTIAL/PLATELET
BASOS ABS: 0 10*3/uL (ref 0.0–0.1)
Basophils Relative: 0 %
Eosinophils Absolute: 0 10*3/uL (ref 0.0–0.7)
Eosinophils Relative: 0 %
HEMATOCRIT: 36.8 % (ref 36.0–46.0)
Hemoglobin: 12.2 g/dL (ref 12.0–15.0)
LYMPHS ABS: 1.5 10*3/uL (ref 0.7–4.0)
LYMPHS PCT: 19 %
MCH: 32.9 pg (ref 26.0–34.0)
MCHC: 33.2 g/dL (ref 30.0–36.0)
MCV: 99.2 fL (ref 78.0–100.0)
MONO ABS: 0.1 10*3/uL (ref 0.1–1.0)
Monocytes Relative: 1 %
NEUTROS ABS: 6.4 10*3/uL (ref 1.7–7.7)
Neutrophils Relative %: 80 %
Platelets: 284 10*3/uL (ref 150–400)
RBC: 3.71 MIL/uL — AB (ref 3.87–5.11)
RDW: 12.5 % (ref 11.5–15.5)
WBC: 8.1 10*3/uL (ref 4.0–10.5)

## 2016-08-13 LAB — CBG MONITORING, ED
GLUCOSE-CAPILLARY: 104 mg/dL — AB (ref 65–99)
GLUCOSE-CAPILLARY: 416 mg/dL — AB (ref 65–99)
Glucose-Capillary: 42 mg/dL — CL (ref 65–99)
Glucose-Capillary: 271 mg/dL — ABNORMAL HIGH (ref 65–99)

## 2016-08-13 LAB — I-STAT CG4 LACTIC ACID, ED: Lactic Acid, Venous: 3.29 mmol/L (ref 0.5–1.9)

## 2016-08-13 LAB — LIPASE, BLOOD: LIPASE: 24 U/L (ref 11–51)

## 2016-08-13 LAB — GLUCOSE, CAPILLARY: GLUCOSE-CAPILLARY: 207 mg/dL — AB (ref 65–99)

## 2016-08-13 LAB — ETHANOL: ALCOHOL ETHYL (B): 14 mg/dL — AB (ref <5)

## 2016-08-13 MED ORDER — LORAZEPAM 2 MG/ML IJ SOLN: 1.0000 mg | Freq: Four times a day (QID) | INTRAMUSCULAR | Status: DC | PRN

## 2016-08-13 MED ORDER — INSULIN ASPART 100 UNIT/ML ~~LOC~~ SOLN: 0.0000 [IU] | Freq: Three times a day (TID) | SUBCUTANEOUS | Status: DC

## 2016-08-13 MED ORDER — HYDRALAZINE HCL 10 MG PO TABS: 10.0000 mg | ORAL_TABLET | Freq: Four times a day (QID) | ORAL | Status: DC | PRN

## 2016-08-13 MED ORDER — ADULT MULTIVITAMIN W/MINERALS CH
1.0000 | ORAL_TABLET | Freq: Every day | ORAL | Status: DC
  Administered 2016-08-13 – 2016-08-14 (×2): 1 via ORAL
  Filled 2016-08-13 (×2): qty 1

## 2016-08-13 MED ORDER — ATORVASTATIN CALCIUM 80 MG PO TABS: 80.0000 mg | ORAL_TABLET | Freq: Every day | ORAL | Status: DC

## 2016-08-13 MED ORDER — DEXTROSE 5 % IV BOLUS
1000.0000 mL | Freq: Once | INTRAVENOUS | Status: AC
  Administered 2016-08-13: 1000 mL via INTRAVENOUS

## 2016-08-13 MED ORDER — AMLODIPINE BESYLATE 5 MG PO TABS
5.0000 mg | ORAL_TABLET | Freq: Every day | ORAL | Status: DC
  Administered 2016-08-13 – 2016-08-14 (×2): 5 mg via ORAL
  Filled 2016-08-13 (×2): qty 1

## 2016-08-13 MED ORDER — SODIUM CHLORIDE 0.9 % IV SOLN
INTRAVENOUS | Status: AC
  Administered 2016-08-13: 21:00:00 via INTRAVENOUS

## 2016-08-13 MED ORDER — ENOXAPARIN SODIUM 30 MG/0.3ML ~~LOC~~ SOLN
30.0000 mg | SUBCUTANEOUS | Status: DC
  Filled 2016-08-13 (×2): qty 0.3

## 2016-08-13 MED ORDER — DEXTROSE 50 % IV SOLN
1.0000 | Freq: Once | INTRAVENOUS | Status: AC
  Administered 2016-08-13: 50 mL via INTRAVENOUS

## 2016-08-13 MED ORDER — VITAMIN B-1 100 MG PO TABS
100.0000 mg | ORAL_TABLET | Freq: Every day | ORAL | Status: DC
  Administered 2016-08-13 – 2016-08-14 (×2): 100 mg via ORAL
  Filled 2016-08-13 (×2): qty 1

## 2016-08-13 MED ORDER — FERROUS SULFATE 325 (65 FE) MG PO TABS
325.0000 mg | ORAL_TABLET | Freq: Every day | ORAL | Status: DC
  Administered 2016-08-14: 325 mg via ORAL
  Filled 2016-08-13: qty 1

## 2016-08-13 MED ORDER — VITAMIN D 1000 UNITS PO TABS
1000.0000 [IU] | ORAL_TABLET | Freq: Every day | ORAL | Status: DC
  Administered 2016-08-13 – 2016-08-14 (×2): 1000 [IU] via ORAL
  Filled 2016-08-13 (×4): qty 1

## 2016-08-13 MED ORDER — LORAZEPAM 1 MG PO TABS: 1.0000 mg | ORAL_TABLET | Freq: Four times a day (QID) | ORAL | Status: DC | PRN

## 2016-08-13 MED ORDER — SODIUM CHLORIDE 0.9 % IV BOLUS (SEPSIS)
1000.0000 mL | Freq: Once | INTRAVENOUS | Status: AC
  Administered 2016-08-13: 1000 mL via INTRAVENOUS

## 2016-08-13 MED ORDER — THIAMINE HCL 100 MG/ML IJ SOLN
100.0000 mg | Freq: Every day | INTRAMUSCULAR | Status: DC
  Filled 2016-08-13: qty 2

## 2016-08-13 MED ORDER — DEXTROSE 50 % IV SOLN
INTRAVENOUS | Status: AC
  Filled 2016-08-13: qty 50

## 2016-08-13 MED ORDER — FOLIC ACID 1 MG PO TABS
1.0000 mg | ORAL_TABLET | Freq: Every day | ORAL | Status: DC
  Administered 2016-08-13 – 2016-08-14 (×2): 1 mg via ORAL
  Filled 2016-08-13 (×2): qty 1

## 2016-08-13 NOTE — H&P (Signed)
[facility] Admission History and Physical Service Pager: [phone]  Patient name: Kristine Shaffer Medical record number: [id number] Date of birth: 07-29-1940 Age: 76 y.o. Gender: female  Primary Care Provider: Adin Hector, MD Consultants: none Code Status: Full   Chief Complaint: altered mental status  Assessment and Plan: Kristine Shaffer is a 76 y.o. female presenting with altered mental status. PMH is significant for HTN, HLD, malnutrition.   Altered mental status. Likely 2/2 profound hypoglycemia. Found unresponsive at home with CBG 37. Now hyperglycemic to 416 after aggressive correction of blood glucose. DDx included CVA/TIA since per EMS patient had R sided weakness with slurred speech and elevated BP to 201/96  but CT head and MRI neg for acute infarction and patient showing no focal deficits other than being tremulous that per patient and family did not occur until after being given D50. - Place in observation, attending Dr. Erin Hearing - s/p 12.5g D50 by EMS and additional amp D50 with D5 bolus in ED - monitor on telemetry  - risk stratification labs: last a1c 4.4 on 07/13/16. TSH, lipid panel pending - Monitor CBGs AC qhs - As she just had episode of hypoglycemia and last A1c rather low, would avoid giving insulin and let hyperglycemia correct itself - PT/OT eval, appreciate recommendations  HTN. Patient at recent office visit with low-normal BP on labetalol but presented with elevated BP to 201/96.  Previously on norvasc but stopped d/t dizziness in the setting of low-normal BP. Suspect has been uncontrolled for some time, will start to normalize BP.  - start norvasc 5mg  qd, titrate up as needed - prn hydralazine 10mg  q6h prn for SBP>180 or >RJJ>884  Metabolic anion gap acidosis. Likely 2/2 lactic acidosis from poor po intake and EtOH. No signs of sepsis  - s/p NS bolus and D5 bolus  in ED - MIVF NS@75cc /hr - monitor BMET - trend lactic  acid  Malnutrition Per chart review, only intake is 1-2 meals per day of low calorie foods - Nutrition consult, appreciate recommendations  HLD. At home on atorvastatin 80mg  qd.  - lipid panel pending  - continue home med  EtOH use. Drinks 3 shots of liquor approximately 3 times a week, last drink was last night. EtOH level 14 on admission. No h/o of withdrawal - monitor on CIWA - UDS  FEN/GI: heart healthy diet, NS@75cc /hr Prophylaxis: lovenox  Disposition: admit to FPTS  History of Present Illness: Kristine Shaffer is a 76 y.o. female presenting with altered mental status. Accompanied by 2 nieces.   Last known normal is unclear, per nieces may have been Wednesday night or last night. Was found this morning laying in bed and minimally responsive with slurred speech. Per patient recalls events intermittently that she could not get up from bed, after prompting from family she then recalled not being able to move her R arm or leg. She also recalls EMS being called. Family members states that she returned back to her neurological baseline in the ambulance after being giving sugar through her IV.   At baseline, she cooks and walks to [employer]. Lives at home with 2 nieces and sister but interacts infrequently. States yesterday she was her normal self, ate her usual diet of 1 meal of oodles of noodles and drank a [place]. States she drinks alcohol regularly, liquor, 3 standard shots of liquor that she drinks 3 times a week.   Review Of Systems: Per HPI with the following additions: No CP, SOB, vision changes, dizziness,  numbness, weakness.  Otherwise 12 point review of systems was performed and was unremarkable.  Patient Active Problem List   Diagnosis Date Noted  . HLD (hyperlipidemia) 07/13/2016  . Healthcare maintenance 07/13/2016  . Malnourished ([place]) 07/13/2016  . Acute pancreatitis 02/04/2016  . Renal failure 02/04/2016  . Lactic acidosis 02/04/2016  . Benign essential HTN    Past  Medical History: Past Medical History:  Diagnosis Date  . Acute gallstone pancreatitis   . Benign essential HTN   . HLD (hyperlipidemia)   . Pancreatitis    Past Surgical History: Past Surgical History:  Procedure Laterality Date  . CHOLECYSTECTOMY N/A 02/09/2016   Procedure: LAPAROSCOPIC CHOLECYSTECTOMY WITH INTRAOPERATIVE CHOLANGIOGRAM;  Surgeon: Arta Bruce Kinsinger, MD;  Location: [facility];  Service: General;  Laterality: N/A;  . ERCP N/A 02/11/2016   Procedure: ENDOSCOPIC RETROGRADE CHOLANGIOPANCREATOGRAPHY (ERCP);  Surgeon: Clarene Essex, MD;  Location: [facility];  Service: Endoscopy;  Laterality: N/A;   Social History: Social History  Substance Use Topics  . Smoking status: Never Smoker  . Smokeless tobacco: Never Used  . Alcohol use Yes     Comment: about 3 times a week, 3+ shots== last drink last night   Additional social history: snuff daily since childhood,  Shots liquor 3x daily about 3 times weekly, last drink - unsure, maybe yesterday, no h/o withdrawal, no illicit drugs  Please also refer to relevant sections of EMR.  Family History: Family History  Problem Relation Age of Onset  . Hypertension Sister    Allergies and Medications: No Known Allergies No current facility-administered medications on file prior to encounter.    Current Outpatient Prescriptions on File Prior to Encounter  Medication Sig Dispense Refill  . atorvastatin (LIPITOR) 80 MG tablet Take 1 tablet (80 mg total) by mouth daily at 6 PM. 90 tablet 3  . cholecalciferol (VITAMIN D) 1000 units tablet Take 1 tablet (1,000 Units total) by mouth daily. 90 tablet 3  . ferrous sulfate 325 (65 FE) MG tablet Take 1 tablet (325 mg total) by mouth daily with breakfast. 90 tablet 3  . labetalol (NORMODYNE) 200 MG tablet Take 1 tablet (200 mg total) by mouth 2 (two) times daily. 180 tablet 3  . Multiple Vitamin (MULTIVITAMIN WITH MINERALS) TABS tablet Take 1 tablet by mouth daily.      Objective: BP (!)  209/92   Pulse 93   Temp 97.6 F (36.4 C) (Oral)   Resp (!) 25   Ht 4\' 10"  (1.473 m)   Wt 95 lb (43.1 kg)   SpO2 99%   BMI 19.86 kg/m  Exam: General: Very thin woman sitting up in bed, in NAD. Appears tremulous.  HEENT: [place], AT. PERRL, EOMI. Cardiovascular: RRR, no murmurs Respiratory: CTAB, normal effort on room air Abdomen: soft, nontender, nondistended, + bowel sounds Extremities: no LE edema, moving all limbs equally. Skin: warm and dry, no rashes Neuro: alert and oriented to person and place which is her baseline, does not know the year or the president. CN II-XI intact, strength 5/5, sensation intact. Normal finger to nose.  Labs and Imaging: CBC BMET   Recent Labs Lab 08/13/16 1506  WBC 8.1  HGB 12.2  HCT 36.8  PLT 284    Recent Labs Lab 08/13/16 1506  NA 139  K 3.8  CL 103  CO2 15*  BUN 19  CREATININE 1.08*  GLUCOSE 62*  CALCIUM 9.3     Lactic acid 3.29 POC trop 0.00 EtOH 14  Ct Head Wo  Contrast  Result Date: 08/13/2016 CLINICAL DATA:  Mental status change. EXAM: CT HEAD WITHOUT CONTRAST TECHNIQUE: Contiguous axial images were obtained from the base of the skull through the vertex without intravenous contrast. COMPARISON:  None. FINDINGS: Brain: No subdural, epidural, or subarachnoid hemorrhage. No mass, mass effect, or midline shift. Ventricles and sulci are prominent consistent with atrophy. Cerebellum, brainstem, and basal cisterns are normal. Scattered white matter changes and lacunar infarcts identified. No acute cortical ischemia or infarct is noted. No midline shift. Vascular: Calcified atherosclerosis is seen in the intracranial carotid arteries. Skull: Normal. Negative for fracture or focal lesion. Sinuses/Orbits: Mucosal thickening in the maxillary sinuses with probable postsurgical changes on the right. Paranasal sinuses, mastoid air cells, and middle ears are well aerated otherwise. Other: None. IMPRESSION: 1. 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 No acute intracranial process identified. Electronically Signed   By: Dorise Bullion III M.D   On: 08/13/2016 15:38   Mr Brain Wo Contrast  Result Date: 08/13/2016 CLINICAL DATA:  Acute presentation with hypoglycemia and altered mental status. Right-sided weakness. EXAM: MRI HEAD WITHOUT CONTRAST TECHNIQUE: Multiplanar, multiecho pulse sequences of the brain and surrounding structures were obtained without intravenous contrast. COMPARISON:  Head CT same day FINDINGS: Brain: Diffusion imaging is negative for acute or subacute infarction. There chronic small-vessel ischemic changes of the pons. No focal cerebellar insult. Cerebral hemispheres show generalized atrophy. There are old lacunar infarctions within the basal ganglia left more than right. Chronic small-vessel ischemic changes of the deep white matter. No cortical or large vessel territory infarction. No mass lesion, hemorrhage, hydrocephalus or extra-axial collection. Vascular: Major vessels at the base of the brain show flow. Skull and upper cervical spine: Negative Sinuses/Orbits: Chronic sinus inflammatory changes. Orbits negative. Other: None significant IMPRESSION: No acute infarction. Chronic small-vessel ischemic changes as outlined above, most notable in the pons and basal ganglia. Electronically Signed   By: Nelson Chimes M.D.   On: 08/13/2016 19:26    Bufford Lope, DO 08/13/2016, 5:07 PM PGY-1, [place] Intern pager: [phone], text pages welcome  Upper Level Addendum:  I have seen and evaluated this patient along with Dr. Shawna Orleans and reviewed the above note, making necessary revisions in pink.  Virginia Crews, MD, MPH PGY-3,  [facility] 08/13/2016 8:20 PM

## 2016-08-13 NOTE — ED Notes (Signed)
Patient transported to X-ray 

## 2016-08-13 NOTE — ED Notes (Signed)
Dr Maryan Rued given a copy of lactic acid results 3.29

## 2016-08-13 NOTE — ED Notes (Signed)
Transported to MRI

## 2016-08-13 NOTE — ED Notes (Signed)
Pt ate 1/2 of a [place] sandwich and a few bites of applesauce. This RN waited about 30 minutes and checked pt CBG. CBG 42. Gave pt peanut butter crackers to eat.

## 2016-08-13 NOTE — ED Triage Notes (Signed)
Pt to ED via [facility] from home with family calling because pt was unresponsive-- on EMS arrival-- pt was noticeable weak on right side with slurred speech. CBG was 37-- received 12.5gm D50, CBG =138 then 108.  On arrival to hospital pt is awake, alert, confused as to year and date, oriented to person and place. Equal grips, mae x4 .  Admits to drinking at least 3 shots of liquor last night.

## 2016-08-13 NOTE — ED Notes (Signed)
This RN attempted blood draw, unsuccessful. Notified phlebotomy.

## 2016-08-13 NOTE — ED Notes (Signed)
Attempted report 

## 2016-08-13 NOTE — ED Provider Notes (Signed)
[facility] DEPT Provider Note   CSN: [id number] Arrival date & time:        History   Chief Complaint Chief Complaint  Patient presents with  . Hypoglycemia  . Altered Mental Status    HPI Kristine Shaffer is a 76 y.o. female who presents with hypoglycemia and AMS. PMHx significant for hx of pancreatitis s/p cholecystectomy, ETOH use, malnourishment. She is a poor historian and history is obtained from the patient and family at bedside. She lives with her sister and niece. They state she has not been taking her prescribed medicines for the past 2 days and has been drinking alcohol. When asked regarding why she has not been taking her medicines she denies this. This morning she was unresponsive to her family members and weak on the right side. EMS was called and glucose was 37. After glucose, she was more responsive and alert. No recent illness, headache, chest pain, SOB, abdominal pain, N/V/D, dysuria. She has chronic decreased PO intake and family struggle with getting her to eat enough.  HPI  Past Medical History:  Diagnosis Date  . Acute gallstone pancreatitis   . Benign essential HTN   . HLD (hyperlipidemia)   . Pancreatitis     Patient Active Problem List   Diagnosis Date Noted  . HLD (hyperlipidemia) 07/13/2016  . Healthcare maintenance 07/13/2016  . Malnourished ([place]) 07/13/2016  . Acute pancreatitis 02/04/2016  . Renal failure 02/04/2016  . Lactic acidosis 02/04/2016  . Benign essential HTN     Past Surgical History:  Procedure Laterality Date  . CHOLECYSTECTOMY N/A 02/09/2016   Procedure: LAPAROSCOPIC CHOLECYSTECTOMY WITH INTRAOPERATIVE CHOLANGIOGRAM;  Surgeon: Arta Bruce Kinsinger, MD;  Location: [facility];  Service: General;  Laterality: N/A;  . ERCP N/A 02/11/2016   Procedure: ENDOSCOPIC RETROGRADE CHOLANGIOPANCREATOGRAPHY (ERCP);  Surgeon: Clarene Essex, MD;  Location: [facility];  Service: Endoscopy;  Laterality: N/A;    OB History    Gravida Para Term  Preterm AB Living   0 0 0 0 0 0   SAB TAB Ectopic Multiple Live Births   0 0 0 0 0       Home Medications    Prior to Admission medications   Medication Sig Start Date End Date Taking? Authorizing Provider  atorvastatin (LIPITOR) 80 MG tablet Take 1 tablet (80 mg total) by mouth daily at 6 PM. 07/13/16   Verner Mould, MD  cholecalciferol (VITAMIN D) 1000 units tablet Take 1 tablet (1,000 Units total) by mouth daily. 07/13/16   Verner Mould, MD  ferrous sulfate 325 (65 FE) MG tablet Take 1 tablet (325 mg total) by mouth daily with breakfast. 07/13/16   Verner Mould, MD  labetalol (NORMODYNE) 200 MG tablet Take 1 tablet (200 mg total) by mouth 2 (two) times daily. 07/13/16   Verner Mould, MD  Multiple Vitamin (MULTIVITAMIN WITH MINERALS) TABS tablet Take 1 tablet by mouth daily.    Historical Provider, MD    Family History Family History  Problem Relation Age of Onset  . Hypertension Sister     Social History Social History  Substance Use Topics  . Smoking status: Never Smoker  . Smokeless tobacco: Never Used  . Alcohol use Yes     Comment: about 3 times a week, 3+ shots== last drink last night     Allergies   Patient has no known allergies.   Review of Systems Review of Systems  Constitutional: Positive for activity change, appetite change and unexpected weight change.  Negative for chills and fever.  Respiratory: Negative for shortness of breath.   Cardiovascular: Negative for chest pain.  Gastrointestinal: Negative for abdominal pain, diarrhea, nausea and vomiting.  Genitourinary: Negative for dysuria.  Neurological: Positive for weakness. Negative for numbness and headaches.  All other systems reviewed and are negative.    Physical Exam Updated Vital Signs BP (S) (!) 201/96 (BP Location: Left Arm) Comment: pt has not taken BP meds in 2-3 days  Pulse 74   Temp 97.6 F (36.4 C) (Oral)   Resp 16   Ht 4\' 10"  (1.473 m)    Wt 43.1 kg   SpO2 99%   BMI 19.86 kg/m   Physical Exam  Constitutional: She is oriented to person, place, and time. She appears well-developed and well-nourished. No distress.  Elderly female in NAD, alert, cooperative. Tremulous  HENT:  Head: Normocephalic and atraumatic.  Eyes: Conjunctivae are normal. Pupils are equal, round, and reactive to light. Right eye exhibits no discharge. Left eye exhibits no discharge. No scleral icterus.  Neck: Normal range of motion.  Cardiovascular: Normal rate and regular rhythm.  Exam reveals no gallop and no friction rub.   No murmur heard. Pulmonary/Chest: Effort normal and breath sounds normal. No respiratory distress. She has no wheezes. She has no rales. She exhibits no tenderness.  Abdominal: Soft. Bowel sounds are normal. She exhibits no distension and no mass. There is no tenderness. There is no rebound and no guarding. No hernia.  Neurological: She is alert and oriented to person, place, and time. She displays tremor (bilateral hands). GCS eye subscore is 4. GCS verbal subscore is 5. GCS motor subscore is 6.  Mental Status:  Alert, mildly confused. Speech fluent without evidence of aphasia. Able to follow 2 step commands without difficulty.  Cranial Nerves:  II:  Peripheral visual fields grossly normal, pupils equal, round, reactive to light III,IV, VI: ptosis not present, extra-ocular motions intact bilaterally  V,VII: smile symmetric, facial light touch sensation equal VIII: hearing grossly normal to voice  X: uvula elevates symmetrically  XI: bilateral shoulder shrug symmetric and strong XII: midline tongue extension without fassiculations Motor:  Normal tone. 5/5 in upper and lower extremities bilaterally including strong and equal grip strength and dorsiflexion/plantar flexion Sensory: Pinprick and light touch normal in all extremities.  Cerebellar: normal finger-to-nose with bilateral upper extremities Gait: normal gait and  balance CV: distal pulses palpable throughout    Skin: Skin is warm and dry.  Psychiatric: She has a normal mood and affect. Her behavior is normal.  Nursing note and vitals reviewed.    ED Treatments / Results  Labs (all labs ordered are listed, but only abnormal results are displayed) Labs Reviewed  CBC WITH DIFFERENTIAL/PLATELET - Abnormal; Notable for the following:       Result Value   RBC 3.71 (*)    All other components within normal limits  ETHANOL - Abnormal; Notable for the following:    Alcohol, Ethyl (B) 14 (*)    All other components within normal limits  CBG MONITORING, ED - Abnormal; Notable for the following:    Glucose-Capillary 104 (*)    All other components within normal limits  I-STAT CG4 LACTIC ACID, ED - Abnormal; Notable for the following:    Lactic Acid, Venous 3.29 (*)    All other components within normal limits  COMPREHENSIVE METABOLIC PANEL  URINALYSIS, ROUTINE W REFLEX MICROSCOPIC  LIPASE, BLOOD  I-STAT TROPOININ, ED    EKG  EKG Interpretation None  Radiology Ct Head Wo Contrast  Result Date: 08/13/2016 CLINICAL DATA:  Mental status change. EXAM: CT HEAD WITHOUT CONTRAST TECHNIQUE: Contiguous axial images were obtained from the base of the skull through the vertex without intravenous contrast. COMPARISON:  None. FINDINGS: Brain: No subdural, epidural, or subarachnoid hemorrhage. No mass, mass effect, or midline shift. Ventricles and sulci are prominent consistent with atrophy. Cerebellum, brainstem, and basal cisterns are normal. Scattered white matter changes and lacunar infarcts identified. No acute cortical ischemia or infarct is noted. No midline shift. Vascular: Calcified atherosclerosis is seen in the intracranial carotid arteries. Skull: Normal. Negative for fracture or focal lesion. Sinuses/Orbits: Mucosal thickening in the maxillary sinuses with probable postsurgical changes on the right. Paranasal sinuses, mastoid air cells, and  middle ears are well aerated otherwise. Other: None. IMPRESSION: 1. 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 No acute intracranial process identified. Electronically Signed   By: Dorise Bullion III M.D   On: 08/13/2016 15:38    Procedures Procedures (including critical care time)  Medications Ordered in ED Medications  dextrose 50 % solution (not administered)  sodium chloride 0.9 % bolus 1,000 mL (1,000 mLs Intravenous New Bag/Given 08/13/16 1609)  dextrose 50 % solution 50 mL (50 mLs Intravenous Given 08/13/16 1609)     Initial Impression / Assessment and Plan / ED Course  I have reviewed the triage vital signs and the nursing notes.  Pertinent labs & imaging results that were available during my care of the patient were reviewed by me and considered in my medical decision making (see chart for details).  76 year old female presents with hypoglycemic event. BP on arrival is markedly elevated. All other vitals are normal. CBG in the ED is 104. She is alert and able to take PO - food was given. CBC unremarkable. CMP remarkable for CO2 of 15, glucose 62, anion gap of 21. Lactic acid is 3.29 - likely from malnourishment and alcohol use. Trop is 0. EKG is NSR. CT head negative.   On recheck, she is still alert and eating. Recheck CBG is 42. 1 amp D50 ordered. IVF given. Will admit for monitoring and fluids for her lactic acidosis. Spoke with Family medicine who will admit.  Final Clinical Impressions(s) / ED Diagnoses   Final diagnoses:  Hypoglycemia  Altered mental status, unspecified altered mental status type  ETOH abuse  Elevated lactic acid level    New Prescriptions New Prescriptions   No medications on file     Recardo Evangelist, PA-C 08/13/16 [place], MD 08/15/16 [phone]

## 2016-08-14 DIAGNOSIS — E162 Hypoglycemia, unspecified: Secondary | ICD-10-CM

## 2016-08-14 DIAGNOSIS — R4182 Altered mental status, unspecified: Secondary | ICD-10-CM | POA: Diagnosis not present

## 2016-08-14 LAB — RAPID URINE DRUG SCREEN, HOSP PERFORMED
Amphetamines: NOT DETECTED
BARBITURATES: NOT DETECTED
Benzodiazepines: NOT DETECTED
Cocaine: NOT DETECTED
Opiates: NOT DETECTED
TETRAHYDROCANNABINOL: NOT DETECTED

## 2016-08-14 LAB — CBC
HEMATOCRIT: 32.8 % — AB (ref 36.0–46.0)
Hemoglobin: 11.1 g/dL — ABNORMAL LOW (ref 12.0–15.0)
MCH: 33 pg (ref 26.0–34.0)
MCHC: 33.8 g/dL (ref 30.0–36.0)
MCV: 97.6 fL (ref 78.0–100.0)
Platelets: 273 10*3/uL (ref 150–400)
RBC: 3.36 MIL/uL — ABNORMAL LOW (ref 3.87–5.11)
RDW: 12.4 % (ref 11.5–15.5)
WBC: 8.4 10*3/uL (ref 4.0–10.5)

## 2016-08-14 LAB — URINALYSIS, ROUTINE W REFLEX MICROSCOPIC
BILIRUBIN URINE: NEGATIVE
GLUCOSE, UA: NEGATIVE mg/dL
Ketones, ur: 5 mg/dL — AB
NITRITE: NEGATIVE
Protein, ur: NEGATIVE mg/dL
SPECIFIC GRAVITY, URINE: 1.006 (ref 1.005–1.030)
pH: 5 (ref 5.0–8.0)

## 2016-08-14 LAB — BASIC METABOLIC PANEL
Anion gap: 10 (ref 5–15)
BUN: 13 mg/dL (ref 6–20)
CHLORIDE: 104 mmol/L (ref 101–111)
CO2: 24 mmol/L (ref 22–32)
Calcium: 8.9 mg/dL (ref 8.9–10.3)
Creatinine, Ser: 1.07 mg/dL — ABNORMAL HIGH (ref 0.44–1.00)
GFR calc Af Amer: 57 mL/min — ABNORMAL LOW (ref >60)
GFR calc non Af Amer: 49 mL/min — ABNORMAL LOW (ref >60)
GLUCOSE: 78 mg/dL (ref 65–99)
POTASSIUM: 3.6 mmol/L (ref 3.5–5.1)
Sodium: 138 mmol/L (ref 135–145)

## 2016-08-14 LAB — TSH: TSH: 1.241 u[IU]/mL (ref 0.350–4.500)

## 2016-08-14 LAB — GLUCOSE, CAPILLARY
GLUCOSE-CAPILLARY: 136 mg/dL — AB (ref 65–99)
Glucose-Capillary: 84 mg/dL (ref 65–99)

## 2016-08-14 MED ORDER — AMLODIPINE BESYLATE 5 MG PO TABS: 5.0000 mg | ORAL_TABLET | Freq: Every day | ORAL | 0 refills | Status: DC

## 2016-08-14 NOTE — Progress Notes (Signed)
Delrae Alfred to be D/C'd Home per MD order.  Discussed prescriptions and follow up appointments with the patient. Prescriptions given to patient, medication list explained in detail. Pt verbalized understanding.  Allergies as of 08/14/2016   No Known Allergies     Medication List    TAKE these medications   amLODipine 5 MG tablet Commonly known as:  NORVASC Take 1 tablet (5 mg total) by mouth daily. Start taking on:  08/15/2016   atorvastatin 80 MG tablet Commonly known as:  LIPITOR Take 1 tablet (80 mg total) by mouth daily at 6 PM.   cholecalciferol 1000 units tablet Commonly known as:  VITAMIN D Take 1 tablet (1,000 Units total) by mouth daily.   ferrous sulfate 325 (65 FE) MG tablet Take 1 tablet (325 mg total) by mouth daily with breakfast.   labetalol 200 MG tablet Commonly known as:  NORMODYNE Take 1 tablet (200 mg total) by mouth 2 (two) times daily.   multivitamin with minerals Tabs tablet Take 1 tablet by mouth daily.       Vitals:   08/14/16 0213 08/14/16 1002  BP: (!) 160/64 (!) 185/77  Pulse: 90 79  Resp: 20 19  Temp: 98.6 F (37 C) 98.2 F (36.8 C)    Skin clean, dry and intact without evidence of skin break down, no evidence of skin tears noted. IV catheter discontinued intact. Site without signs and symptoms of complications. Dressing and pressure applied. Pt denies pain at this time. No complaints noted.  An After Visit Summary was printed and given to the patient. Patient escorted via [place], and D/C home via private auto.  Haywood Lasso BSN, RN [facility] Phone [phone]

## 2016-08-14 NOTE — Evaluation (Signed)
Physical Therapy Evaluation Patient Details Name: Kristine Shaffer MRN: [id number] DOB: 06/08/1940 Today's Date: 08/14/2016   History of Present Illness  Pt is a 76 yo female admitted through ED on 08/13/16 after being found unresponsive at home. Initially pt was worked up for stroke vs TIA, but CT and MRI were negative. PT was diagnosed with possible malnutrition as she only eats 1-2 meals a day and drinks 3-4 shots of liquour a week and had one the day before admission. PMH significant for HTN, HLD, malnutrition.   Clinical Impression  Pt presents with the above diagnosis for therapy evaluation. Prior to admission, pt was living with her family in a single level apartment and reports being completely independent. Pt is able to transfer and perform gait without an AD or any hands on assistance from this clinician. Pt is Mod I to supervision for all mobility and will not require any additional therapy follow-up at discharge. All education was complete, questions were answered and PT will sign off. Please re-order if any changes arise.     Follow Up Recommendations No PT follow up    Equipment Recommendations  None recommended by PT    Recommendations for Other Services       Precautions / Restrictions Precautions Precautions: None Restrictions Weight Bearing Restrictions: No      Mobility  Bed Mobility Overal bed mobility: Modified Independent             General bed mobility comments: OOB in chair when PT arrives. Rn reports pt required 1 person assist to chair  Transfers Overall transfer level: Modified independent Equipment used: None             General transfer comment: Able to stand from arm chair without assistance requiring increased time.   Ambulation/Gait Ambulation/Gait assistance: Supervision;Independent Ambulation Distance (Feet): 500 Feet Assistive device: None Gait Pattern/deviations: Step-through pattern;Decreased step length - right;Decreased step  length - left Gait velocity: decreased Gait velocity interpretation: Below normal speed for age/gender General Gait Details: NBOS and decreased step length bilaterally, no LOB or diffiuclty with head movements during this session.   Stairs            Wheelchair Mobility    Modified Rankin (Stroke Patients Only)       Balance Overall balance assessment: No apparent balance deficits (not formally assessed)                                           Pertinent Vitals/Pain Pain Assessment: No/denies pain    Home Living Family/patient expects to be discharged to:: Private residence Living Arrangements: Other relatives (2 neices and sister) Available Help at Discharge: Family Type of Home: Apartment Home Access: Level entry     Home Layout: One level Home Equipment: None      Prior Function Level of Independence: Independent         Comments: independent per patient, no family present to verify     Hand Dominance   Dominant Hand: Right    Extremity/Trunk Assessment   Upper Extremity Assessment Upper Extremity Assessment: Overall WFL for tasks assessed    Lower Extremity Assessment Lower Extremity Assessment: Overall WFL for tasks assessed    Cervical / Trunk Assessment Cervical / Trunk Assessment: Normal  Communication   Communication: No difficulties  Cognition Arousal/Alertness: Awake/alert Behavior During Therapy: WFL for tasks assessed/performed Overall Cognitive Status: Within  Functional Limits for tasks assessed                                        General Comments General comments (skin integrity, edema, etc.): No family present to verify information, pt reports complete independent prior to admission.     Exercises     Assessment/Plan    PT Assessment Patent does not need any further PT services  PT Problem List         PT Treatment Interventions      PT Goals (Current goals can be found in the  Care Plan section)       Frequency     Barriers to discharge        Co-evaluation               End of Session Equipment Utilized During Treatment: Gait belt Activity Tolerance: Patient tolerated treatment well Patient left: in chair;with call bell/phone within reach Nurse Communication: Mobility status PT Visit Diagnosis: Difficulty in walking, not elsewhere classified (R26.2)    Time: 8828-0034 PT Time Calculation (min) (ACUTE ONLY): 9 min   Charges:   PT Evaluation $PT Eval Low Complexity: 1 Procedure     PT G Codes:        Scheryl Marten PT, DPT  [phone]   Shanon Rosser 08/14/2016, 1:47 PM

## 2016-08-14 NOTE — Discharge Summary (Signed)
[facility] Discharge Summary  Patient name: Kristine Shaffer Medical record number: [id number] Date of birth: Aug 21, 1940 Age: 76 y.o. Gender: female Date of Admission: 08/13/2016  Date of Discharge: 08/14/2016 Admitting Physician: Lind Covert, MD  Primary Care Provider: Adin Hector, MD Consultants: None  Indication for Hospitalization: AMS  Discharge Diagnoses/Problem List:   Patient Active Problem List   Diagnosis Date Noted  . Hypoglycemia   . Altered mental state 08/13/2016  . HLD (hyperlipidemia) 07/13/2016  . Healthcare maintenance 07/13/2016  . Malnourished ([place]) 07/13/2016  . Acute pancreatitis 02/04/2016  . Renal failure 02/04/2016  . Lactic acidosis 02/04/2016  . Benign essential HTN     Disposition: Home  Discharge Condition: Improved  Discharge Exam:  Vitals:   08/14/16 0213 08/14/16 1002  BP: (!) 160/64 (!) 185/77  Pulse: 90 79  Resp: 20 19  Temp: 98.6 F (37 C) 98.2 F (36.8 C)   General: Very thin woman sitting up in bed, in NAD. Cardiovascular: RRR, no murmurs Respiratory: CTAB, normal effort on room air Abdomen: soft, nontender, nondistended, + bowel sounds Extremities: no LE edema, moving all limbs equally. Skin: warm and dry, no rashes Neuro: alert and oriented to person and place which is her baseline, does not know the year or the president. CN II-XI intact, strength 5/5, sensation intact. Gait normal.   Brief Hospital Course:  Kristine Shaffer is a 76 y.o. female who presented with altered mental status. PMH is significant for HTN, HLD, malnutrition.  Altered mental status.Likely 2/2 profound hypoglycemia. Found unresponsive at home with CBG 37. Corrected after aggressive treatments. DDx included CVA/TIA since per EMS patient had R sided weakness with slurred speech and elevated BP to 201/96 but CT head and MRI neg for acute infarction and patient showing no focal deficits. Had a metabolic anion gap  acidosis secondary to lactic acidosis from poor po intake and EtOH. No signs of sepsis. Lactic acid trended to normal after fluids and anion gap closed. Appeared baseline at time of discharge without further hypoglycemic events. Need to follow-up with family that patient did not taking any oral hypoglycemics.   HTN.Elevated BPs. On admission 201/96. She was only on BB. Suspect they have been elevated for a while. Norvasc was added to regimen.   Malnutrition Per chart review, only intake is 1-2 meals per day of low calorie foods. Albumin on admission 4. Unable to get nutrition consult over weekend. Reccommended nutrition supplemention.   EtOH use. Drinks 3 shots of liquor approximately 3 times a week. EtOH level 14 on admission. No h/o of withdrawal. CIWA remained low during admission. Need to have further discussion with family about alcohol use.   The patient's other chronic conditions were stable during this hospitalization and the patient was continued on home medications.    Issues for Follow Up:  1. Do med reconciliation to make sure she is taking appropriate medications 2. Check BP on new medication; adjust as needed 3. Malnutrition: need to make sure she is eating and being fed adequately 4. Discuss alcohol use further; maybe a larger problem  Significant Procedures: None  Significant Labs and Imaging:   Recent Labs Lab 08/13/16 1506 08/14/16 0450  WBC 8.1 8.4  HGB 12.2 11.1*  HCT 36.8 32.8*  PLT 284 273    Recent Labs Lab 08/13/16 1506 08/14/16 0450  NA 139 138  K 3.8 3.6  CL 103 104  CO2 15* 24  GLUCOSE 62* 78  BUN 19 13  CREATININE 1.08* 1.07*  CALCIUM 9.3 8.9  ALKPHOS 73  --   AST 40  --   ALT 19  --   ALBUMIN 4.0  --    TSH 1.24 UDS neg EtOH 14 LA 3.29>>1.9  Ct Head Wo Contrast IMPRESSION: 1. 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 No acute intracranial process identified. Electronically Signed   By: Dorise Bullion III M.D   On:  08/13/2016 15:38   Mr Brain Wo Contrast IMPRESSION: No acute infarction. Chronic small-vessel ischemic changes as outlined above, most notable in the pons and basal ganglia. Electronically Signed   By: Nelson Chimes M.D.   On: 08/13/2016 19:26   Results/Tests Pending at Time of Discharge: None  Discharge Medications:  Allergies as of 08/14/2016   No Known Allergies     Medication List    TAKE these medications   amLODipine 5 MG tablet Commonly known as:  NORVASC Take 1 tablet (5 mg total) by mouth daily. Start taking on:  08/15/2016   atorvastatin 80 MG tablet Commonly known as:  LIPITOR Take 1 tablet (80 mg total) by mouth daily at 6 PM.   cholecalciferol 1000 units tablet Commonly known as:  VITAMIN D Take 1 tablet (1,000 Units total) by mouth daily.   ferrous sulfate 325 (65 FE) MG tablet Take 1 tablet (325 mg total) by mouth daily with breakfast.   labetalol 200 MG tablet Commonly known as:  NORMODYNE Take 1 tablet (200 mg total) by mouth 2 (two) times daily.   multivitamin with minerals Tabs tablet Take 1 tablet by mouth daily.       Discharge Instructions: Please refer to Patient Instructions section of EMR for full details.  Patient was counseled important signs and symptoms that should prompt return to medical care, changes in medications, dietary instructions, activity restrictions, and follow up appointments.   Follow-Up Appointments:   Follow-up Information    Adin Hector, MD. Go on 08/15/2016.   Specialty:  Family Medicine Why:  @10am  for hospital follow-up Contact information: [place] [address] [phone]           Katheren Shams, DO 08/14/2016, 3:34 PM PGY-3, [place]

## 2016-08-14 NOTE — Progress Notes (Signed)
Late Entry:  Lactic Acid 2.7 , this nurse was notified at 2109. This nurse paged MD and was told at 2127 by MD that the value was ok. Continue fluids.   Eleanora Neighbor, RN

## 2016-08-14 NOTE — Progress Notes (Signed)
Family Medicine Teaching Service Daily Progress Note Intern Pager: [phone]  Patient name: Kristine Shaffer Medical record number: [id number] Date of birth: 01/29/1941 Age: 76 y.o. Gender: female  Primary Care Provider: Adin Hector, MD Consultants: None Code Status: Full  Pt Overview and Major Events to Date:  4/28 - Admitted for AMS  Assessment and Plan: Kristine Shaffer is a 76 y.o. female presenting with altered mental status. PMH is significant for HTN, HLD, malnutrition.   Altered mental status. Likely 2/2 profound hypoglycemia. Found unresponsive at home with CBG 37. Corrected after aggressive treatments. DDx included CVA/TIA since per EMS patient had R sided weakness with slurred speech and elevated BP to 201/96  but CT head and MRI neg for acute infarction and patient showing no focal deficits other than being tremulous that per patient and family did not occur until after being given D50. Appears to be at baseline mentation this morning.  - s/p 12.5g D50 by EMS and additional amp D50 with D5 bolus in ED - CBGs corrected - monitor on telemetry; patient refusing - risk stratification labs: last a1c 4.4 on 07/13/16. TSH normal, hyperlipidemia - Monitor CBGs AC qhs - PT/OT eval, appreciate recommendations  HTN. Elevated BPs. On admission 201/96 - continue norvasc 5mg  qd, titrate up as needed - prn hydralazine 10mg  q6h prn for SBP>180 or >CVE>938  Metabolic anion gap acidosis. Likely 2/2 lactic acidosis from poor po intake and EtOH. No signs of sepsis. Lactic acid trended to normal after fluids. Anion gap closed this morning. Resolved - s/p NS bolus and D5 bolus  in ED  Malnutrition Per chart review, only intake is 1-2 meals per day of low calorie foods. Albumin on admission 4. - Nutrition consult, appreciate recommendations  HLD. At home on atorvastatin 80mg  qd. Last lipid panel 06/2016 with total cholesterol 275, LDL 110.  - continue home med  EtOH use. Drinks 3 shots of  liquor approximately 3 times a week, last drink was last night. EtOH level 14 on admission. No h/o of withdrawal - CIWA scores 1 - UDS negative  FEN/GI: heart healthy diet, SLIV Prophylaxis: lovenox  Disposition: Home pending PT/OT recs  Subjective:  Doing well this morning. No overnight events. Voices no concerns.   Objective: Temp:  [97.6 F (36.4 C)-98.8 F (37.1 C)] 98.6 F (37 C) (04/29 0213) Pulse Rate:  [74-93] 90 (04/29 0213) Resp:  [16-29] 20 (04/29 0213) BP: (160-209)/(64-96) 160/64 (04/29 0213) SpO2:  [97 %-100 %] 100 % (04/29 0213) Weight:  [95 lb (43.1 kg)-95 lb 0.3 oz (43.1 kg)] 95 lb 0.3 oz (43.1 kg) (04/28 2026) Physical Exam: General: Very thin woman sitting up in bed, in NAD. Cardiovascular: RRR, no murmurs Respiratory: CTAB, normal effort on room air Abdomen: soft, nontender, nondistended, + bowel sounds Extremities: no LE edema, moving all limbs equally. Skin: warm and dry, no rashes Neuro: alert and oriented to person and place which is her baseline, does not know the year or the president. CN II-XI intact, strength 5/5, sensation intact. Gait normal.   Laboratory:  Recent Labs Lab 08/13/16 1506 08/14/16 0450  WBC 8.1 8.4  HGB 12.2 11.1*  HCT 36.8 32.8*  PLT 284 273    Recent Labs Lab 08/13/16 1506 08/14/16 0450  NA 139 138  K 3.8 3.6  CL 103 104  CO2 15* 24  BUN 19 13  CREATININE 1.08* 1.07*  CALCIUM 9.3 8.9  PROT 7.7  --   BILITOT 1.0  --   ALKPHOS 73  --  ALT 19  --   AST 40  --   GLUCOSE 62* 78    Imaging/Diagnostic Tests: Ct Head Wo Contrast  Result Date: 08/13/2016 IMPRESSION: 1. 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 No acute intracranial process identified. Electronically Signed   By: Dorise Bullion III M.D   On: 08/13/2016 15:38   Mr Brain Wo Contrast  Result Date: 08/13/2016 IMPRESSION: No acute infarction. Chronic small-vessel ischemic changes as outlined above, most notable in the pons and basal  ganglia. Electronically Signed   By: Nelson Chimes M.D.   On: 08/13/2016 19:26    Kristine Shams, DO 08/14/2016, 7:57 AM PGY-3, [place] Intern pager: [phone], text pages welcome

## 2016-08-14 NOTE — Discharge Instructions (Signed)
Please go to follow-up appointment tomorrow with PCP at 10am  New BP medication added  Unsure what caused low blood sugar. Making sure you are taking the appropriate medications and not anyone else's is important  Need to stay adequately feed and hydrated. Nutrition supplements recommended to drink throughout day (ie Breeze or ensure)   Hypoglycemia Hypoglycemia occurs when the level of sugar (glucose) in the blood is too low. Glucose is a type of sugar that provides the body's main source of energy. Certain hormones (insulin and glucagon) control the level of glucose in the blood. Insulin lowers blood glucose, and glucagon increases blood glucose. Hypoglycemia can result from having too much insulin in the bloodstream, or from not eating enough food that contains glucose. Hypoglycemia can happen in people who do or do not have diabetes. It can develop quickly, and it can be a medical emergency. What are the causes? Hypoglycemia occurs most often in people who have diabetes. If you have diabetes, hypoglycemia may be caused by:  Diabetes medicine.  Not eating enough, or not eating often enough.  Increased physical activity.  Drinking alcohol, especially when you have not eaten recently. If you do not have diabetes, hypoglycemia may be caused by:  A tumor in the pancreas. The pancreas is the organ that makes insulin.  Not eating enough, or not eating for long periods at a time (fasting).  Severe infection or illness that affects the liver, heart, or kidneys.  Certain medicines. You may also have reactive hypoglycemia. This condition causes hypoglycemia within 4 hours of eating a meal. This may occur after having stomach surgery. Sometimes, the cause of reactive hypoglycemia is not known. What increases the risk? Hypoglycemia is more likely to develop in:  People who have diabetes and take medicines to lower blood glucose.  People who abuse alcohol.  People who have a severe  illness. What are the signs or symptoms? Hypoglycemia may not cause any symptoms. If you have symptoms, they may include:  Hunger.  Anxiety.  Sweating and feeling clammy.  Confusion.  Dizziness or feeling light-headed.  Sleepiness.  Nausea.  Increased heart rate.  Headache.  Blurry vision.  Seizure.  Nightmares.  Tingling or numbness around the mouth, lips, or tongue.  A change in speech.  Decreased ability to concentrate.  A change in coordination.  Restless sleep.  Tremors or shakes.  Fainting.  Irritability. How is this diagnosed? Hypoglycemia is diagnosed with a blood test to measure your blood glucose level. This blood test is done while you are having symptoms. Your health care provider may also do a physical exam and review your medical history. If you do not have diabetes, other tests may be done to find the cause of your hypoglycemia. How is this treated? This condition can often be treated by immediately eating or drinking something that contains glucose, such as:  3-4 sugar tablets (glucose pills).  Glucose gel, 15-gram tube.  Fruit juice, 4 oz (120 mL).  Regular soda (not diet soda), 4 oz (120 mL).  Low-fat milk, 4 oz (120 mL).  Several pieces of hard candy.  Sugar or honey, 1 Tbsp. Treating Hypoglycemia If You Have Diabetes   If you are alert and able to swallow safely, follow the 15:15 rule:  Take 15 grams of a rapid-acting carbohydrate. Rapid-acting options include:  1 tube of glucose gel.  3 glucose pills.  6-8 pieces of hard candy.  4 oz (120 mL) of fruit juice.  4 oz (120 ml) of  regular (not diet) soda.  Check your blood glucose 15 minutes after you take the carbohydrate.  If the repeat blood glucose level is still at or below 70 mg/dL (3.9 mmol/L), take 15 grams of a carbohydrate again.  If your blood glucose level does not increase above 70 mg/dL (3.9 mmol/L) after 3 tries, seek emergency medical care.  After your  blood glucose level returns to normal, eat a meal or a snack within 1 hour. Treating Severe Hypoglycemia  Severe hypoglycemia is when your blood glucose level is at or below 54 mg/dL (3 mmol/L). Severe hypoglycemia is an emergency. Do not wait to see if the symptoms will go away. Get medical help right away. Call your local emergency services (911 in the [place] Do not drive yourself to the hospital. If you have severe hypoglycemia and you cannot eat or drink, you may need an injection of glucagon. A family member or close friend should learn how to check your blood glucose and how to give you a glucagon injection. Ask your health care provider if you need to have an emergency glucagon injection kit available. Severe hypoglycemia may need to be treated in a hospital. The treatment may include getting glucose through an IV tube. You may also need treatment for the cause of your hypoglycemia. Follow these instructions at home: General instructions   Avoid any diets that cause you to not eat enough food. Talk with your health care provider before you start any new diet.  Take over-the-counter and prescription medicines only as told by your health care provider.  Limit alcohol intake to no more than 1 drink per day for nonpregnant women and 2 drinks per day for men. One drink equals 12 oz of beer, 5 oz of wine, or 1 oz of hard liquor.  Keep all follow-up visits as told by your health care provider. This is important. If You Have Diabetes:    Make sure you know the symptoms of hypoglycemia.  Always have a rapid-acting carbohydrate snack with you to treat low blood sugar.  Follow your diabetes management plan, as told by your health care provider. Make sure you:  Take your medicines as directed.  Follow your exercise plan.  Follow your meal plan. Eat on time, and do not skip meals.  Check your blood glucose as often as directed. Make sure to check your blood glucose before and after exercise.  If you exercise longer or in a different way than usual, check your blood glucose more often.  Follow your sick day plan whenever you cannot eat or drink normally. Make this plan in advance with your health care provider.  Share your diabetes management plan with people in your workplace, school, and household.  Check your urine for ketones when you are ill and as told by your health care provider.  Carry a medical alert card or wear medical alert jewelry. If You Have Reactive Hypoglycemia or Low Blood Sugar From Other Causes:   Monitor your blood glucose as told by your health care provider.  Follow instructions from your health care provider about eating or drinking restrictions. Contact a health care provider if:  You have problems keeping your blood glucose in your target range.  You have frequent episodes of hypoglycemia. Get help right away if:  You continue to have hypoglycemia symptoms after eating or drinking something containing glucose.  Your blood glucose is at or below 54 mg/dL (3 mmol/L).  You have a seizure.  You faint. These symptoms  may represent a serious problem that is an emergency. Do not wait to see if the symptoms will go away. Get medical help right away. Call your local emergency services (911 in the [place] Do not drive yourself to the hospital. This information is not intended to replace advice given to you by your health care provider. Make sure you discuss any questions you have with your health care provider. Document Released: 04/04/2005 Document Revised: 09/16/2015 Document Reviewed: 05/08/2015 Elsevier Interactive Patient Education  2017 [employer].

## 2016-08-14 NOTE — Progress Notes (Signed)
OT Screen    08/14/16 1200  OT Visit Information  Last OT Received On 08/14/16  Reason Eval/Treat Not Completed OT screened, no needs identified, will sign off (Pt communicates that pt is performing at functional baseline.)   [employer], OTR/L [phone]

## 2016-08-15 ENCOUNTER — Ambulatory Visit (INDEPENDENT_AMBULATORY_CARE_PROVIDER_SITE_OTHER): Payer: Medicare Other | Admitting: Internal Medicine

## 2016-08-15 ENCOUNTER — Encounter: Payer: Self-pay | Admitting: Internal Medicine

## 2016-08-15 DIAGNOSIS — I10 Essential (primary) hypertension: Secondary | ICD-10-CM | POA: Diagnosis present

## 2016-08-15 DIAGNOSIS — Z789 Other specified health status: Secondary | ICD-10-CM

## 2016-08-15 DIAGNOSIS — Z7289 Other problems related to lifestyle: Secondary | ICD-10-CM

## 2016-08-15 DIAGNOSIS — E46 Unspecified protein-calorie malnutrition: Secondary | ICD-10-CM

## 2016-08-15 DIAGNOSIS — F1090 Alcohol use, unspecified, uncomplicated: Secondary | ICD-10-CM | POA: Insufficient documentation

## 2016-08-15 DIAGNOSIS — IMO0002 Reserved for concepts with insufficient information to code with codable children: Secondary | ICD-10-CM | POA: Insufficient documentation

## 2016-08-15 NOTE — Assessment & Plan Note (Signed)
Discussed importance of proper nutrition. Patient says she personally is not concerned about her weight or nutrition status, however understands that others are concerned and that is important for her to be well-nourished. Patient set goal of drinking at least 2 Ensure shakes per day (one with each meal). Will sweeten with packet of artificial sweetener if not sweet enough for patient to drink.  - F/u in two weeks

## 2016-08-15 NOTE — Assessment & Plan Note (Signed)
BP elevated to 200s on admission, so Norvasc started in addition to labetalol. In office today, BP 110/58. Upon charge review, patient's only other recorded BP in office was 108/58 in 05/2016. Patient also with elevated BP during previous admission in 01/2016, but no elevated BPs outpatient. Given current mild hypotension without having taken amlodipine today and fall risk, will not continue amlodipine at this time.  - F/u in two weeks. If BP elevated then can consider resuming amlodipine - Continue labetalol

## 2016-08-15 NOTE — Patient Instructions (Signed)
It was nice seeing you again today Ms. Rogerts!  We discussed three things today during your appointment.   1. Do not continue to take the new blood pressure medication (amlodipine or Norvasc). We will check your blood pressure at your follow up appointment in two weeks and start the medication at that time if we need to.   2. You set the goal to begin drinking an Ensure shake with each meal every day. If the shake is not sweet enough, you can add a little bit of artificial sweetener like Splenda or Sweet and Low. The more shakes you can drink, the better, but try to drink at least two in addition to eating at least two meals per day.   3. You set the goal to stop drinking alcohol. We discussed removing alcohol from your house and not buying any more alcohol. We also discussed drinking more water and the Ensure shakes in place of alcohol.   I will see you back in two weeks to see how you are doing.   If you have any questions or concerns, please feel free to call the clinic.   Be well,  Dr. Avon Gully

## 2016-08-15 NOTE — Assessment & Plan Note (Signed)
Patient reports 3 shots of whiskey 3 times per week, exceeding recommended amount for female. Denies withdrawal symptoms. Identifies negative side effects of drinking without any positives. Patient and her niece in agreement that quitting drinking would be beneficial to patient. Patient formulated goal to completely abstain from alcohol from now on. Says she will remove alcohol from her house, and her niece says she will double check to make sure it is all removed. Patient also confirming that she will not purchase more alcohol.  - F/u in two weeks

## 2016-08-15 NOTE — Progress Notes (Signed)
Subjective:   Patient: Kristine Shaffer       Birthdate: 1941-04-14       MRN: [id number]      HPI  Kristine Shaffer is a 76 y.o. female presenting for hospital f/u.   Patient recently admitted from 4/28-4/29 for altered mental status 2/2 hypoglycemia. Patient found down at home with CBG of 37. CBG improved with treatment and patient had no further episodes of hypoglycemia. Also noted R sided weakness during event, however CT head and MRI were negative for infarct and patient had no deficits on exam. Also noted to have EtOH level of 14 on admission. Patient hypertensive throughout admission, so was also started on amlodipine in additional to home labetalol during admission.  Since discharge yesterday, patient reports no complaints. Says R sided weakness has resolved completely. No lightheadedness or dizziness. Patient did not pick up amlodipine after discharge and so has not taken today. BP 110/58 in office today.   Discussed malnutrition with patient, which was noted during admission. Both in hospital and at appt today patient reporting eating about two meals per day. Patient's niece is present during encounter and says meals as typically very well. Patient says she does not eat more because she is not hungry, and has never eaten much. Typically for first meal of the day patient will eat noodles, and maybe and egg or sausage. For second meal of day she will eat beans, rice, or chicken. She has been encouraged to drink Ensure in the past, but says it is not sweet enough. She has tried vanilla and chocolate and says she does not like the taste of either. She does drink water.   Also discussed patient's alcohol use, given elevated EtOH level on admission. Patient reports drinking 3 shots of whiskey three days a week. Drinks at home alone. Says she cannot think of any benefits of drinking, but has noticed the negative side effects of staying home alone and not interacting with others when she drinks. Cannot name  reason for drinking, says she just feels she needs to. Denies history of withdrawal or withdrawal symptoms. Says she thinks she would not have difficulty quitting drinking.   Smoking status reviewed. Patient is never smoker.   Review of Systems See HPI.     Objective:  Physical Exam  Constitutional: She is oriented to person, place, and time.  Thin elderly female in NAD  HENT:  Head: Normocephalic and atraumatic.  Eyes: Conjunctivae and EOM are normal. Right eye exhibits no discharge. Left eye exhibits no discharge.  Pulmonary/Chest: Effort normal. No respiratory distress.  Neurological: She is alert and oriented to person, place, and time.  5/5 strength upper and lower extremities bilaterally. Full grip strength bilaterally. Able to walk without assistance without difficulty. No focal deficits noted.   Skin: Skin is warm and dry.  Psychiatric: Affect and judgment normal.      Assessment & Plan:  Benign essential HTN BP elevated to 200s on admission, so Norvasc started in addition to labetalol. In office today, BP 110/58. Upon charge review, patient's only other recorded BP in office was 108/58 in 05/2016. Patient also with elevated BP during previous admission in 01/2016, but no elevated BPs outpatient. Given current mild hypotension without having taken amlodipine today and fall risk, will not continue amlodipine at this time.  - F/u in two weeks. If BP elevated then can consider resuming amlodipine - Continue labetalol  Alcohol use Patient reports 3 shots of whiskey 3 times per week, exceeding  recommended amount for female. Denies withdrawal symptoms. Identifies negative side effects of drinking without any positives. Patient and her niece in agreement that quitting drinking would be beneficial to patient. Patient formulated goal to completely abstain from alcohol from now on. Says she will remove alcohol from her house, and her niece says she will double check to make sure it is all  removed. Patient also confirming that she will not purchase more alcohol.  - F/u in two weeks   Malnourished [facility]) Discussed importance of proper nutrition. Patient says she personally is not concerned about her weight or nutrition status, however understands that others are concerned and that is important for her to be well-nourished. Patient set goal of drinking at least 2 Ensure shakes per day (one with each meal). Will sweeten with packet of artificial sweetener if not sweet enough for patient to drink.  - F/u in two weeks   Adin Hector, MD, MPH PGY-2 [facility] Pager [phone]

## 2016-08-18 NOTE — Progress Notes (Signed)
Late Entry G-Codes    08/14/16 1341  PT G-Codes **NOT FOR INPATIENT CLASS**  Functional Assessment Tool Used Clinical judgement;AM-PAC 6 Clicks Basic Mobility  Functional Limitation Mobility: Walking and moving around  Mobility: Walking and Moving Around Current Status [phone]) [facility]  Mobility: Walking and Moving Around Goal Status [phone]) [facility]  Mobility: Walking and Moving Around Discharge Status [phone]) [place] PT, DPT  [phone]

## 2016-09-21 ENCOUNTER — Encounter: Payer: Self-pay | Admitting: Internal Medicine

## 2016-09-21 ENCOUNTER — Ambulatory Visit (INDEPENDENT_AMBULATORY_CARE_PROVIDER_SITE_OTHER): Payer: Medicare Other | Admitting: Internal Medicine

## 2016-09-21 DIAGNOSIS — Z789 Other specified health status: Secondary | ICD-10-CM

## 2016-09-21 DIAGNOSIS — E46 Unspecified protein-calorie malnutrition: Secondary | ICD-10-CM | POA: Diagnosis not present

## 2016-09-21 DIAGNOSIS — Z7289 Other problems related to lifestyle: Secondary | ICD-10-CM

## 2016-09-21 DIAGNOSIS — I10 Essential (primary) hypertension: Secondary | ICD-10-CM

## 2016-09-21 MED ORDER — MIRTAZAPINE 15 MG PO TABS: 15.0000 mg | ORAL_TABLET | Freq: Every day | ORAL | 1 refills | Status: DC

## 2016-09-21 NOTE — Assessment & Plan Note (Signed)
3 pound weight loss since last visit despite patient reporting that she is now drinking two Ensure drinks a day. Food intake still very minimal per both patient and her niece's reports. Patient is now concerned about her weight loss, which shows greater insight than at last visit. Patient now amenable to beginning appetite stimulant. Also discussed evaluation at [facility] clinic, to which patient agreed.  - Begin Remeron 15mg  qhs - Patient to schedule appt with geri clinic

## 2016-09-21 NOTE — Progress Notes (Signed)
Subjective:   Patient: Kristine Shaffer       Birthdate: 05-30-1940       MRN: [id number]      HPI  Jerita Wimbush is a 76 y.o. female presenting for follow up of HTN, decreased appetite, and EtOH use.   HTN BP low at last visit, so amlodipine stopped. Patient still taking labetalol as prescribed. Does not check BP at home. No recent falls. No headaches or changes in vision. Patient has taken labetalol today.   Decreased appetite At last visit, patient set the goal of eating at least two meals a day and drinking an Ensure shake with each meal. She reports that she has met this goal. She eats a piece of sausage and one egg for breakfast each morning, though does not typically finish this. Her niece cooks dinner for her, and she may eat some chicken but never finishes the meal. She has been drinking Ensure shakes twice a day. She says she does not eat more because she is not hungry. She has lost three pounds since last visit. Unlike last visit, she is now concerned about her weight loss. Patient does use snuff every day, and despite knowing that nicotine is an appetite suppressant, is not interested in quitting.   EtOH use At last visit, patient set the goal of completely stopping alcohol use. She said she was going to throw away all remaining alcohol at her house and not buy any more. Her niece was going to check the house to make sure she actually did this. Patient says she actually did not throw away the alcohol that was at her house, but instead drank it all. Says her last drink was over a month ago, and she has not bought anymore alcohol since drinking what was left at her house. Her niece has not checked the patient's room to make sure there is no alcohol, however her niece reports that there is no alcohol elsewhere in the house. Patient denies any symptoms of withdrawal. She says she is "somewhat glad" that she stopped drinking because now she feels that she "doesn't need it." She does report that  she generally feels no different however and her appetite has not improved.   Smoking status reviewed. Patient uses snuff daily.   Review of Systems See HPI.     Objective:  Physical Exam  Constitutional: She is oriented to person, place, and time.  Thin elderly female in NAD  HENT:  Head: Normocephalic and atraumatic.  Eyes: Conjunctivae and EOM are normal. Right eye exhibits no discharge. Left eye exhibits no discharge.  Cardiovascular: Normal rate, regular rhythm and normal heart sounds.   No murmur heard. Pulmonary/Chest: Effort normal and breath sounds normal. No respiratory distress. She has no wheezes.  Neurological: She is alert and oriented to person, place, and time. Gait normal.  Skin: Skin is warm and dry.  Psychiatric: Affect and judgment normal.       Assessment & Plan:  Benign essential HTN BP initially elevated at 382/50, but systolic improved to 539 upon checking a second time. As such, will not resume amlodipine at this time since patient is a fall risk.  - Continue labetalol  Malnourished ([place]) 3 pound weight loss since last visit despite patient reporting that she is now drinking two Ensure drinks a day. Food intake still very minimal per both patient and her niece's reports. Patient is now concerned about her weight loss, which shows greater insight than at last visit. Patient now  amenable to beginning appetite stimulant. Also discussed evaluation at [facility] clinic, to which patient agreed.  - Begin Remeron 51m qhs - Patient to schedule appt with geri clinic   Alcohol use Reports no EtOH use in over a month. No reported symptoms or signs of withdrawal. Congratulated patient on success and encouraged continued abstinence.   Precepted with Dr. NNori Riis   AAdin Hector MD, MPH PGY-2 [facility] Pager [phone]

## 2016-09-21 NOTE — Assessment & Plan Note (Signed)
Reports no EtOH use in over a month. No reported symptoms or signs of withdrawal. Congratulated patient on success and encouraged continued abstinence.

## 2016-09-21 NOTE — Patient Instructions (Addendum)
It was nice seeing you again today Kristine Shaffer!  Please begin taking Remeron (mirtazepine) one tablet at nighttime before bed.   Keep taking your other medications as you have been.   If you have any questions or concerns, please feel free to call the clinic.   Be well,  Dr. Avon Gully

## 2016-09-21 NOTE — Assessment & Plan Note (Signed)
BP initially elevated at 471/58, but systolic improved to 063 upon checking a second time. As such, will not resume amlodipine at this time since patient is a fall risk.  - Continue labetalol

## 2016-10-13 ENCOUNTER — Ambulatory Visit (INDEPENDENT_AMBULATORY_CARE_PROVIDER_SITE_OTHER): Payer: Medicare Other | Admitting: Family Medicine

## 2016-10-13 VITALS — BP 132/70 | HR 87 | Temp 98.3°F | Ht <= 58 in | Wt 93.6 lb

## 2016-10-13 DIAGNOSIS — E538 Deficiency of other specified B group vitamins: Secondary | ICD-10-CM | POA: Diagnosis not present

## 2016-10-13 DIAGNOSIS — F1099 Alcohol use, unspecified with unspecified alcohol-induced disorder: Secondary | ICD-10-CM | POA: Diagnosis not present

## 2016-10-13 DIAGNOSIS — F0151 Vascular dementia with behavioral disturbance: Secondary | ICD-10-CM | POA: Diagnosis not present

## 2016-10-13 DIAGNOSIS — G309 Alzheimer's disease, unspecified: Secondary | ICD-10-CM

## 2016-10-13 DIAGNOSIS — D649 Anemia, unspecified: Secondary | ICD-10-CM

## 2016-10-13 DIAGNOSIS — R634 Abnormal weight loss: Secondary | ICD-10-CM | POA: Diagnosis not present

## 2016-10-13 DIAGNOSIS — IMO0002 Reserved for concepts with insufficient information to code with codable children: Secondary | ICD-10-CM

## 2016-10-13 DIAGNOSIS — F1090 Alcohol use, unspecified, uncomplicated: Secondary | ICD-10-CM

## 2016-10-13 DIAGNOSIS — H543 Unqualified visual loss, both eyes: Secondary | ICD-10-CM

## 2016-10-13 DIAGNOSIS — F0281 Dementia in other diseases classified elsewhere with behavioral disturbance: Secondary | ICD-10-CM

## 2016-10-13 DIAGNOSIS — R32 Unspecified urinary incontinence: Secondary | ICD-10-CM | POA: Diagnosis not present

## 2016-10-13 DIAGNOSIS — F02818 Alzheimer's disease, unspecified: Secondary | ICD-10-CM

## 2016-10-13 LAB — POCT SEDIMENTATION RATE: POCT SED RATE: 37 mm/hr — AB (ref 0–22)

## 2016-10-13 NOTE — Progress Notes (Addendum)
[facility]:   Patient is accompanied by: Niece, Orland Jarred. Primary caregiver:Niece, Orland Jarred Patient's lives with their family: patient's sister and two nieces, including Charlesa Ehle.  Patient information was obtained from patient, spouse/SO and past medical records. History/Exam limitations: dementia. Primary Care Provider: Verner Mould, MD Referring provider: Ruthann Cancer, MD Reason for referral:  Chief Complaint  Patient presents with  . Memory Loss  . Weight Loss    History  Weight Loss Unintentional Weight Loss in Older Adults  Diet Type: general; Average meal intake, recent: bfst: part of a sausage patty and part of one egg.                                                Snacks: occasional chips                                                Lunch: rice and a part of sausage patty Patient prepares her own food Patient continues to drink alcohol daily. Fraility: Yes   Medication effects: no difference noted on Mirtazipine in nutritional intake. Niece administers her medications by dissolving her medications in Ensure or Boost, then leaving it to be drank by pt while her nieice is at work until 5 pm.  Pt often does not completely finish the supplement drink with dissolved medications in it.  Level of physical activity: Sedentary;  Independent in ADLs, dependent in almost all iADLs except meal preparation.   Polypharmacy (> 4 medications): Yes   Malignancy Hx*: No   Ongoing inflammatory or increased catabolic condition: No   Recent acute Illness: No   Emotional problems, especially depression: no  Alcoholism / Substance Abuse: Yes   Late-life paranoia: No   Swallowing disorders: No No   Dysgeusia:No  Appetite: No Oral factors (e.g., poorly fitting dentures, caries): Dips Snuff   Food insecurity: No   Hyperthyroidism, hypothyroidism, hyperparathyroidism/hypercalcemia, hypoadrenalism:No    GI  Disorders Hx: No   Nausea and/or Vomiting: No  (Meds associated with nausea and vomiting: Abx, bisphosphonates, digoxin, dopamine agonists, metformin, SS/NRIs, Statins, TCAs)   GI/Biliary surgeries: Yes S/P cholecystectomy and ERCP 2017 for gallstone pancreatitis  Eating problems (e.g., inability to feed self): No   Dental or denture problems: No   Low-salt, low-cholesterol diet: No   social problems: : Yes, isolated when eating food most of the time.     Cognitive impairment: Yes   Immunocompromised: No   Diabetes:No   Organ Failure (Cardiac, Respiratory, Renal, Liver): No    Autoimmune Disorders (RA, SLE, etc): No    Neurologic Conditions: Lacunar basal ganglia infarcts bilaterally   Symptoms:  General Thirst: No  Fever: No  Night Sweats:No   Rigors: No  Fatigue: No    HEENT Headache (Temporal Arteritis): No  Head cold symptoms: No  Oral sores / bad teeth: No  If dentures, well-fitting: No  Cardiovascular Abdominal pain with eating: No  Heart Failure Hx: No  Respiratory Pulmonary Disease Hx: No  Cough: No  Gastrointestinal Indigestion/heartburn: No  Epigastric Pain: No  Hematemesis: No  Nausea and/or Vomiting: No  (Meds associated with nausea and vomiting: Abx, bisphosphonates, digoxin, dopamine agonists, metformin, SS/NRIs, Statins, TCAs)  Melena: No  Hematochezia: No  Abdomina Swelling: No  Bloating: No  Diarrhea: No  Constipation: No  Hematologic Anemia History: Yes  Musculoskeletal Shoulder stiffness:  No  Muscle strength decrease: No  Joint Swelling/pain: No  Neuropsychologic Prolonged sadness: No  Loss of pleasure: No  Paranoia: suspiciousness Anxiousness / fearfullness: No   Cancer Screening History Breast Cancer: No mammogram Cervical Cancer: no Paps in EMR Colorectal Cancer: no colonoscopy screening in EMR Lung Cancer: 02/04/16 CXR (2V): no active disease      Cognitive impairment concern What problems with thinking are  there?  memory loss  When were the changes first noticed?  several year ago  Did this change occur abruptly or gradually?  gradual  How have the changes progressed since then?  gradually worsening  Does their level of alertness change throughout the day?  no  Is their speech disorganized, rambling?  no  Has there been any tremors or abnormal movements?  no  Have they had in hallucinations or delusions:  no  Have they appeared more anxious or sad lately?  no  Do they still have interests or activities they enjor doing?  no  How has their appetite been lately?  are worsening  How has their sleep been lately?  Napping thruout day  Problem behaviors:  Suspiciousness that others are stealing from her.    Compared to 5 to 10 years ago, how is the patient at:  Problems with Judgment, e.g., problem making decisions, bad financial decisions, problems with thinking? are worsening   Less interested in hobbies or previously enjoyed activities? are worsening   Trouble remembering appointments? are worsening    Remembering things about family and friends e.g. names,  occupations, birthdays, addresses?  are worsening  Remembering things that have happened recently?  are worsening  Recalling conversations a few days later?  are worsening  Remembering what day and month it is? show no change  Remembering where things are usually kept?  show no change  Losing things?  show no change  Learning to use a new gadget or machine around the house,e.g., computer, microwave, remote control?  are worsening  Learning new things in general?  are worsening  Following a story in a book or on TV?  show no change  Handling money for shopping?  show no change  Handling financial matters, e.g. their pension,  dealing with the bank?  show no change  Able to cope with unexpected events?  show no change  Getting lost?  are worsening. Pt took a Cab then was driven  around town because shecould not remember where she was and where she wanted to go.   Asking same questions repeatedly or telling  the same story repeatedly to the same person(s)?  no     Outpatient Encounter Prescriptions as of 10/13/2016  Medication Sig  . amLODipine (NORVASC) 5 MG tablet Take 1 tablet (5 mg total) by mouth daily.  Marland Kitchen atorvastatin (LIPITOR) 80 MG tablet Take 1 tablet (80 mg total) by mouth daily at 6 PM.  . cholecalciferol (VITAMIN D) 1000 units tablet Take 1 tablet (1,000 Units total) by mouth daily.  . ferrous sulfate 325 (65 FE) MG tablet Take 1 tablet (325 mg total) by mouth daily with breakfast.  . labetalol (NORMODYNE) 200 MG tablet Take 1 tablet (200 mg total) by mouth 2 (two) times daily.  . mirtazapine (REMERON) 15 MG tablet Take 1 tablet (15 mg total) by mouth at bedtime.  . Multiple Vitamin (MULTIVITAMIN WITH MINERALS)  TABS tablet Take 1 tablet by mouth daily.   No facility-administered encounter medications on file as of 10/13/2016.     History Patient Active Problem List   Diagnosis Date Noted  . Alcohol use disorder ([place]) 08/15/2016    Priority: High  . Malnourished ([place]) 07/13/2016    Priority: High  . Altered mental state 08/13/2016  . HLD (hyperlipidemia) 07/13/2016  . Healthcare maintenance 07/13/2016  . Benign essential HTN    Past Medical History:  Diagnosis Date  . Acute gallstone pancreatitis   . Acute pancreatitis 02/04/2016  . Benign essential HTN   . HLD (hyperlipidemia)   . Hypoglycemia   . Lactic acidosis 02/04/2016  . Pancreatitis   . Renal failure 02/04/2016   Past Surgical History:  Procedure Laterality Date  . CHOLECYSTECTOMY N/A 02/09/2016   Procedure: LAPAROSCOPIC CHOLECYSTECTOMY WITH INTRAOPERATIVE CHOLANGIOGRAM;  Surgeon: Arta Bruce Kinsinger, MD;  Location: [facility];  Service: General;  Laterality: N/A;  . ERCP N/A 02/11/2016   Procedure: ENDOSCOPIC RETROGRADE CHOLANGIOPANCREATOGRAPHY (ERCP);  Surgeon: Clarene Essex, MD;   Location: [facility];  Service: Endoscopy;  Laterality: N/A;   Family History  Problem Relation Age of Onset  . Hypertension Sister    Social History   Social History  . Marital status: Single    Spouse name: N/A  . Number of children: N/A  . Years of education: N/A   Social History Main Topics  . Smoking status: Never Smoker  . Smokeless tobacco: Never Used  . Alcohol use Yes     Comment: about 3 times a week, 3+ shots== last drink last night  . Drug use: No  . Sexual activity: No   Other Topics Concern  . None   Social History Narrative  . None    Cardiovascular Risk Factors: CVA, History of Smoking, Hypertension and Lipids  Personal History of Seizures: No -  Personal History of Stroke: Yes -  Personal History of Psychiatric Disorders: Yes - alcohol use3 disorder  Educational History: 9 years formal education  Basic Activities of Daily Living  AADLs IIndependent [employer] Assistance DDependent  Bbathing xx    Ddressing xx    AAmbulation xx    TToileting xx    EEating xx         Instrumental Activities of Daily Living IADL Independent Needs Assistance Dependent  Cooking x    Housework  x   Manage Medications   x  Manage the telephone   x  Shopping for food, clothes, Meds, etc   x  Use transportation   x  Manage Finances   x    Caregivers in home: Two nieces and patient's sister  Caregiver Burdens: Primary Cargiver, Kamani Magnussen is concerned that she works during the day and that her aunt may come to have greater needs than she can provide.  Gaylene reports that other two relatives in home do not provide care to [facility].    FALLS in last five office visits:  Fall Risk  10/13/2016 09/21/2016 08/15/2016  Falls in the past year? No No No    Health Maintenance reviewed:  There is no immunization history on file for this patient. Health Maintenance Topics with due status: Overdue     Topic Date Due   TETANUS/TDAP 04/[birth year]/1959   DEXA SCAN  04/28/2005   PNA vac Low Risk Adult 04/28/2005    Diet: Regular Nutritional supplements: Boost and Ensure, less than one a day consumed by pt  Geriatric Syndromes: Constipation no ,  Incontinence yes  Dizziness no   Syncope no   Skin problems no   Visual Impairment yes   Hearing impairment no  Eating impairment no  Impaired Memory or Cognition yes   Behavioral problems yes, suspiciousness and alcohol use disorder   Sleep problems yes   Weight loss yes    ROS Denies fevers/chills; denies changes in appetite; denies changes in weight;  Denies changes in vision / hearing / smell / taste; Denies runny nose / ear pain or discharge / sore throat / sinus congestion / cough/w phlegm; Denies chest congestion / wheezing;  Denies chest pain; denies heart beating slower/thumps inside chest; denies racing heart/flutter; Denies dysuria; denies hematuria;  Denies constipation; denies melena/hematochezia; denies diarrhea;  Denies abdominal discomfort/gaseous bloating; denies N/V; denies heart burn;  Denies recent falls/unsteady gait;  Denies unilateral weakness / clumsiness / tingling / numbness; denies tremors;  Denies sadness / anxiety / suicidal tendencies  Vital Signs Weight: 93 lb 9.6 oz (42.5 kg) Body mass index is 19.56 kg/m. CrCl cannot be calculated (Patient's most recent lab result is older than the maximum 21 days allowed.). Body surface area is 1.32 meters squared. Vitals:   10/13/16 1337  BP: 132/70  Pulse: 87  Temp: 98.3 F (36.8 C)  TempSrc: Oral  SpO2: 99%  Weight: 93 lb 9.6 oz (42.5 kg)  Height: 4\' 10"  (1.473 m)   Wt Readings from Last 3 Encounters:  10/13/16 93 lb 9.6 oz (42.5 kg)  09/21/16 94 lb 12.8 oz (43 kg)  08/15/16 97 lb 3.2 oz (44.1 kg)    Hearing Screening   Method: Audiometry   125Hz  250Hz  500Hz  1000Hz  2000Hz  3000Hz  4000Hz  6000Hz  8000Hz   Right ear:   40 40 40  40    Left ear:   40 40 40  40      Visual Acuity Screening   Right eye Left eye  Both eyes  Without correction: 20/40 20/70 20/40   With correction:       Physical Examination:  VS reviewed GEN: Alert, Cooperative, Groomed, NAD HEENT: PERRL; EAC bilaterally not occluded, TM's translucent with normal LM, (+) LR;                No cervical LAN, No thyromegaly, No palpable masses COR: RRR, No M/G/R, No JVD, Normal PMI size and location LUNGS: BCTA, No Acc mm use, speaking in full sentences EXT: No peripheral leg edema SKIN: No lesion nor rashes of face/trunk/extremities Neuro:  Muscle Tone normal; Tremor not present  Gait: No significant path deviation, Step-through present  Psych: Normal affect/thought/soft speech/language concrete   Mini-Mental State Examination or Montreal Cognitive Assessment:  Patient did  require additional cues or prompts to complete tasks. Patient was cooperative and attentive to testing tasks Patient did  appear motivated to perform well  No flowsheet data found.      No flowsheet data found.  Geriatric Depression Scale:  3 / 15  Labs  Lab Results  Component Value Date   VITAMINB12 202 02/05/2016    Lab Results  Component Value Date   FOLATE 13.0 10/13/2016    Lab Results  Component Value Date   TSH 1.241 08/14/2016    No results found for: RPR    Chemistry      Component Value Date/Time   NA 138 08/14/2016 0450   NA 144 07/13/2016 1131   K 3.6 08/14/2016 0450   CL 104 08/14/2016 0450   CO2 24 08/14/2016 0450   BUN 13 08/14/2016  0450   BUN 15 07/13/2016 1131   CREATININE 1.07 (H) 08/14/2016 0450      Component Value Date/Time   CALCIUM 8.9 08/14/2016 0450   ALKPHOS 73 08/13/2016 1506   AST 40 08/13/2016 1506   ALT 19 08/13/2016 1506   BILITOT 1.0 08/13/2016 1506       Lab Results  Component Value Date   HGBA1C 4.4 07/13/2016      Lab Results  Component Value Date   WBC 8.4 08/14/2016   HGB 11.1 (L) 08/14/2016   HCT 32.8 (L) 08/14/2016   MCV 97.6 08/14/2016   PLT 273 08/14/2016    Results  for orders placed or performed in visit on 10/13/16 (from the past 24 hour(s))  POCT SEDIMENTATION RATE   Collection Time: 10/13/16  2:50 PM  Result Value Ref Range   POCT SED RATE 37 (A) 0 - 22 mm/hr  Reticulocytes   Collection Time: 10/13/16  2:53 PM  Result Value Ref Range   Retic Ct Pct 2.2 0.6 - 2.6 %  Ferritin   Collection Time: 10/13/16  2:53 PM  Result Value Ref Range   Ferritin 310 (H) 15 - 150 ng/mL  Lipase   Collection Time: 10/13/16  2:53 PM  Result Value Ref Range   Lipase 61 14 - 85 U/L  Methylmalonic Acid, Serum   Collection Time: 10/13/16  2:53 PM  Result Value Ref Range   Methylmalonic Acid WILL FOLLOW    Disclaimer: Comment   Folate   Collection Time: 10/13/16  2:53 PM  Result Value Ref Range   Folate 13.0 >3.0 ng/mL    Imaging   Brain MRI: 08/13/16:There chronic small-vessel ischemic changes of the pons. No focal cerebellar insult. Cerebral hemispheres show generalized atrophy. There are old lacunar infarctions within the basal ganglia left more than right.   Assessment and Plan: Problem List Items Addressed This Visit      High   Alcohol use disorder ([place])    Other Visit Diagnoses    Anemia, unspecified type    -  Primary   Relevant Orders   Reticulocytes (Completed)   Ferritin (Completed)   Methylmalonic Acid, Serum (Completed)   Folate (Completed)   Vitamin B12 deficiency       Relevant Orders   Methylmalonic Acid, Serum (Completed)   Folate (Completed)   Loss of weight       Relevant Orders   Lipase (Completed)   POCT SEDIMENTATION RATE (Completed)      Personal Strengths Supportive family/friends  Support System Strengths Supportive Relationships  Advanced Directives: Code Status: Full code   No flowsheet data found.Contact: First and Last Name if other than the patient involved: Shaolin Armas (Niece) [phone] (home)    Patient to Follow up with Dr Avon Gully prn  45 minutes face to face where spent in total with  counseling / coordination of care took more than 50% of the total time. Counseling involved discussion with patient's niece about applying home health services, role of legal guardianship. Discussion on diagnosis of dementia, its natural course, role of pharmacologic and nonpharmacologic management of cognitive impairment and behavioral & psychological symptoms of dementia. We also discussed role of support groups for caretakers of patients with dementia.  Care was coordinated with Casimer Lanius, MSW, LCSW, who contacted the niece by phone after the consultation.

## 2016-10-13 NOTE — Patient Instructions (Signed)
I believe you have moderate dementia, likely from old mini-strokes that are seen on a recent Head CT.   I would like you to stop the Iron tablets (Ferrous Sulfate) as it can cause loss of appetite.   We are checking some labs to look for causes of weight loss.   We will schedule to see a Gastroenterologist to look into why you are losing wieght.   I recommend you not drink alcohol, spirits, wine or beer.   Give the Mirtazipine another month to see if it will help with increasing your appetite.

## 2016-10-14 ENCOUNTER — Encounter: Payer: Self-pay | Admitting: Family Medicine

## 2016-10-14 ENCOUNTER — Telehealth: Payer: Self-pay | Admitting: Licensed Clinical Social Worker

## 2016-10-14 DIAGNOSIS — E538 Deficiency of other specified B group vitamins: Secondary | ICD-10-CM | POA: Insufficient documentation

## 2016-10-14 DIAGNOSIS — H543 Unqualified visual loss, both eyes: Secondary | ICD-10-CM | POA: Insufficient documentation

## 2016-10-14 DIAGNOSIS — F01518 Vascular dementia, unspecified severity, with other behavioral disturbance: Secondary | ICD-10-CM | POA: Insufficient documentation

## 2016-10-14 DIAGNOSIS — R32 Unspecified urinary incontinence: Secondary | ICD-10-CM | POA: Insufficient documentation

## 2016-10-14 DIAGNOSIS — D649 Anemia, unspecified: Secondary | ICD-10-CM | POA: Insufficient documentation

## 2016-10-14 DIAGNOSIS — G309 Alzheimer's disease, unspecified: Secondary | ICD-10-CM

## 2016-10-14 DIAGNOSIS — R634 Abnormal weight loss: Secondary | ICD-10-CM | POA: Insufficient documentation

## 2016-10-14 DIAGNOSIS — F0151 Vascular dementia with behavioral disturbance: Secondary | ICD-10-CM | POA: Insufficient documentation

## 2016-10-14 NOTE — Progress Notes (Signed)
Social Work consult from Dr. Wendy Poet ref contacting pt's niece, Kristine Shaffer, at (c) [phone], to discuss support system for patient and caregivers, process for community placement to ALF/LTC, senior community resources, and process for applying for legal guardianship.  LCSW called niece ref. the above consult.  Left voice message to call LCSW.   Plan: LCSW will call patient again in 3 to 5 days if no return call is received.  Casimer Lanius, LCSW Licensed Clinical Social Worker [facility]   [phone] 3:26 PM

## 2016-10-14 NOTE — Assessment & Plan Note (Signed)
FAST Stage: Stage 5 (moderately advanced) memory loss, slow information processing, difficulty with abstract concepts and difficulty with complex reasoning decreased problem solving Palliative Performance Score: 80%.  Progression since last assessment: first assessment Medication or substances that may impair patient's cognition: Alcohol Behavioral and Psychological Symptoms of Dementia: suspiciousness  Supportive Interventions for caregiver (support group or individual counseling): Request that Casimer Lanius, MSW, LCSW, contact pt's niece, Delcie Ruppert at patient's home to discuss support system for patient and caregivers, process for community placement to ALF/LTC, senior community resources, and process for applying for legal guardianship.   Physical Activity recommendation: eating outside of her room with others, at least for supper

## 2016-10-14 NOTE — Assessment & Plan Note (Signed)
Recommend ophthalmology referral for evaluation, including screening for glaucoma.

## 2016-10-14 NOTE — Assessment & Plan Note (Signed)
Ongoing problem Uncertain how patient is obtaining her alcohol? Abstinence discussed.

## 2016-10-14 NOTE — Assessment & Plan Note (Signed)
Given low normal serum Vitamin B level, obtained methylmalonic acid level to look for occult vit B12 deficiency.

## 2016-10-14 NOTE — Assessment & Plan Note (Addendum)
Relatively stable weight  With BMI ~ 19.5 to 20.5 % recently.  Minimal caloric intake from poor appetite rather than hypercatabolic state likely process.  Most likely contributor is patient's ongoing alcohol use disorder. Other possible contributing factors: dementia of moderate stage, frailty, social isolation during meals, history of lacunar strokes, ferrous sulfate supplement.   20% patients with unintentional weight loss without cause found after investigation Appetitie naturally diminishes with age related to gastric hormone.  Food intake also deminishes with age due to decrease activity, lowered basal metabolic rate, and loss muscle mass.   So far no change in nutritional intake with mirtazipine trial, though patient may not be taking the dose as directed as her caretaker is dissolving her medications in her nutritional supplement and leaving it for the patient to drink throughout the day.  Drink is often incompletely consumed.   It would be reasonable to pursue, at least once, the malignancy screening tests that patient has not had, including referral to gastroenterology for colonoscopy screening for colorectal cancer, and mammography. Referral for GI consultation completed. May want to obtin mammography in future. Question pap smear if weight loss progresses. Marland Kitchen

## 2016-10-16 LAB — FOLATE: Folate: 13 ng/mL (ref >3.0)

## 2016-10-16 LAB — METHYLMALONIC ACID, SERUM: METHYLMALONIC ACID: 277 nmol/L (ref 0–378)

## 2016-10-16 LAB — RETICULOCYTES: Retic Ct Pct: 2.2 % (ref 0.6–2.6)

## 2016-10-16 LAB — FERRITIN: FERRITIN: 310 ng/mL — AB (ref 15–150)

## 2016-10-16 LAB — LIPASE: LIPASE: 61 U/L (ref 14–85)

## 2016-10-17 ENCOUNTER — Telehealth: Payer: Self-pay | Admitting: Licensed Clinical Social Worker

## 2016-10-17 NOTE — Progress Notes (Addendum)
LCSW received return call from patient's niece Gaylene ref. consult received from Dr. McDiarmid.  Per Niece, she is not ready to place patient in a facility but wants information.  LCSW reviewed facility placement process, payment options, Medicaid process, Power of Attorney and Guardianship process.  Also mailed a copy of all information reviewed including free and low cost resources for legal consults.   Niece appreciative of the information.  Dr. McDiarmid and PCP notified via in-basket.  Plan: Nelle Don will call LCSW if she has questions once she receives the information mailed.  Casimer Lanius, LCSW Licensed Clinical Social Worker [facility]   [phone] 2:33 PM

## 2016-10-31 ENCOUNTER — Other Ambulatory Visit: Payer: Self-pay | Admitting: Gastroenterology

## 2016-10-31 ENCOUNTER — Ambulatory Visit
Admission: RE | Admit: 2016-10-31 | Discharge: 2016-10-31 | Disposition: A | Discharge status: Home / Self Care | Payer: Medicare Other | Source: Ambulatory Visit | Attending: Gastroenterology | Admitting: Gastroenterology

## 2016-10-31 DIAGNOSIS — R630 Anorexia: Secondary | ICD-10-CM

## 2016-10-31 DIAGNOSIS — F0390 Unspecified dementia without behavioral disturbance: Secondary | ICD-10-CM | POA: Diagnosis not present

## 2016-10-31 DIAGNOSIS — R634 Abnormal weight loss: Secondary | ICD-10-CM

## 2016-10-31 DIAGNOSIS — I700 Atherosclerosis of aorta: Secondary | ICD-10-CM | POA: Diagnosis not present

## 2016-11-03 ENCOUNTER — Ambulatory Visit
Admission: RE | Admit: 2016-11-03 | Discharge: 2016-11-03 | Disposition: A | Discharge status: Home / Self Care | Payer: Medicare Other | Source: Ambulatory Visit | Attending: Gastroenterology | Admitting: Gastroenterology

## 2016-11-03 DIAGNOSIS — R634 Abnormal weight loss: Secondary | ICD-10-CM | POA: Diagnosis not present

## 2016-11-03 DIAGNOSIS — I700 Atherosclerosis of aorta: Secondary | ICD-10-CM | POA: Diagnosis not present

## 2016-11-03 MED ORDER — IOPAMIDOL (ISOVUE-300) INJECTION 61%
100.0000 mL | Freq: Once | INTRAVENOUS | Status: AC | PRN
  Administered 2016-11-03: 100 mL via INTRAVENOUS

## 2016-11-15 ENCOUNTER — Other Ambulatory Visit: Payer: Self-pay | Admitting: Internal Medicine

## 2016-11-18 DIAGNOSIS — K2950 Unspecified chronic gastritis without bleeding: Secondary | ICD-10-CM | POA: Diagnosis not present

## 2016-11-18 DIAGNOSIS — R634 Abnormal weight loss: Secondary | ICD-10-CM | POA: Diagnosis not present

## 2016-11-18 DIAGNOSIS — B9681 Helicobacter pylori [H. pylori] as the cause of diseases classified elsewhere: Secondary | ICD-10-CM | POA: Diagnosis not present

## 2016-11-18 DIAGNOSIS — Z1211 Encounter for screening for malignant neoplasm of colon: Secondary | ICD-10-CM | POA: Diagnosis not present

## 2016-11-18 DIAGNOSIS — K317 Polyp of stomach and duodenum: Secondary | ICD-10-CM | POA: Diagnosis not present

## 2016-11-18 DIAGNOSIS — R630 Anorexia: Secondary | ICD-10-CM | POA: Diagnosis not present

## 2016-11-18 DIAGNOSIS — D126 Benign neoplasm of colon, unspecified: Secondary | ICD-10-CM | POA: Diagnosis not present

## 2016-11-18 DIAGNOSIS — K449 Diaphragmatic hernia without obstruction or gangrene: Secondary | ICD-10-CM | POA: Diagnosis not present

## 2016-11-22 DIAGNOSIS — B9681 Helicobacter pylori [H. pylori] as the cause of diseases classified elsewhere: Secondary | ICD-10-CM | POA: Diagnosis not present

## 2016-11-22 DIAGNOSIS — K2950 Unspecified chronic gastritis without bleeding: Secondary | ICD-10-CM | POA: Diagnosis not present

## 2016-11-22 DIAGNOSIS — D126 Benign neoplasm of colon, unspecified: Secondary | ICD-10-CM | POA: Diagnosis not present

## 2016-12-01 DIAGNOSIS — R933 Abnormal findings on diagnostic imaging of other parts of digestive tract: Secondary | ICD-10-CM | POA: Diagnosis not present

## 2016-12-01 DIAGNOSIS — R634 Abnormal weight loss: Secondary | ICD-10-CM | POA: Diagnosis not present

## 2016-12-20 ENCOUNTER — Other Ambulatory Visit: Payer: Self-pay | Admitting: *Deleted

## 2016-12-20 ENCOUNTER — Other Ambulatory Visit: Payer: Self-pay | Admitting: Internal Medicine

## 2016-12-20 MED ORDER — LABETALOL HCL 200 MG PO TABS: 200.0000 mg | ORAL_TABLET | Freq: Two times a day (BID) | ORAL | 3 refills | Status: DC

## 2016-12-20 MED ORDER — ATORVASTATIN CALCIUM 80 MG PO TABS: 80.0000 mg | ORAL_TABLET | Freq: Every day | ORAL | 3 refills | Status: DC

## 2016-12-20 MED ORDER — MIRTAZAPINE 15 MG PO TABS: 15.0000 mg | ORAL_TABLET | Freq: Every day | ORAL | 1 refills | Status: DC

## 2016-12-20 MED ORDER — VITAMIN D3 25 MCG (1000 UNIT) PO TABS: 1000.0000 [IU] | ORAL_TABLET | Freq: Every day | ORAL | 3 refills | Status: DC

## 2016-12-20 MED ORDER — AMLODIPINE BESYLATE 5 MG PO TABS: 5.0000 mg | ORAL_TABLET | Freq: Every day | ORAL | 3 refills | Status: DC

## 2016-12-20 MED ORDER — FERROUS SULFATE 325 (65 FE) MG PO TABS: 325.0000 mg | ORAL_TABLET | Freq: Every day | ORAL | 3 refills | Status: DC

## 2016-12-20 NOTE — Telephone Encounter (Signed)
Patient niece calling, states patient somehow got a hold of all her medications and threw them away. Would like new rxs sent to pharmacy

## 2016-12-21 MED ORDER — MIRTAZAPINE 15 MG PO TABS: 15.0000 mg | ORAL_TABLET | Freq: Every day | ORAL | 1 refills | Status: DC

## 2016-12-21 MED ORDER — LABETALOL HCL 200 MG PO TABS: 200.0000 mg | ORAL_TABLET | Freq: Two times a day (BID) | ORAL | 3 refills | Status: DC

## 2016-12-21 MED ORDER — VITAMIN D3 25 MCG (1000 UNIT) PO TABS: 1000.0000 [IU] | ORAL_TABLET | Freq: Every day | ORAL | 3 refills | Status: DC

## 2016-12-21 MED ORDER — AMLODIPINE BESYLATE 5 MG PO TABS: 5.0000 mg | ORAL_TABLET | Freq: Every day | ORAL | 3 refills | Status: AC

## 2016-12-21 MED ORDER — ATORVASTATIN CALCIUM 80 MG PO TABS: 80.0000 mg | ORAL_TABLET | Freq: Every day | ORAL | 3 refills | Status: DC

## 2016-12-21 NOTE — Telephone Encounter (Signed)
Abby  I would call the pharmacy and inquire as to what the problem is.   Often they have your down incorrectly.  Let me know if something else as we need to get this fixed!  Thanks

## 2016-12-21 NOTE — Telephone Encounter (Signed)
Will send to preceptor.

## 2016-12-21 NOTE — Telephone Encounter (Signed)
I refilled all of these medications for patient yesterday. Apparently there is an issue with my NPI number so medications are not being filled. Will ask that preceptor fill medications, as I currently cannot.   Adin Hector, MD, MPH PGY-3 [facility] Pager [phone]

## 2016-12-21 NOTE — Telephone Encounter (Signed)
Apparently [employer] [employer] form not submitted and they are working on it   I have prescribed the Rxs

## 2016-12-21 NOTE — Telephone Encounter (Signed)
Second request from niece for med refills. Hubbard Hartshorn, RN, BSN

## 2017-01-03 DIAGNOSIS — R634 Abnormal weight loss: Secondary | ICD-10-CM | POA: Diagnosis not present

## 2017-01-03 DIAGNOSIS — R933 Abnormal findings on diagnostic imaging of other parts of digestive tract: Secondary | ICD-10-CM | POA: Diagnosis not present

## 2017-01-04 ENCOUNTER — Encounter: Payer: Self-pay | Admitting: Student in an Organized Health Care Education/Training Program

## 2017-01-04 ENCOUNTER — Ambulatory Visit (INDEPENDENT_AMBULATORY_CARE_PROVIDER_SITE_OTHER): Payer: Medicare Other | Admitting: Student in an Organized Health Care Education/Training Program

## 2017-01-04 VITALS — BP 128/70 | HR 112 | Temp 98.3°F | Ht <= 58 in | Wt 89.4 lb

## 2017-01-04 DIAGNOSIS — R944 Abnormal results of kidney function studies: Secondary | ICD-10-CM | POA: Diagnosis not present

## 2017-01-04 LAB — POCT URINALYSIS DIP (MANUAL ENTRY)
Bilirubin, UA: NEGATIVE
Blood, UA: NEGATIVE
Glucose, UA: NEGATIVE mg/dL
Ketones, POC UA: NEGATIVE mg/dL
NITRITE UA: NEGATIVE
PH UA: 5 (ref 5.0–8.0)
Protein Ur, POC: NEGATIVE mg/dL
Spec Grav, UA: 1.01 (ref 1.010–1.025)
Urobilinogen, UA: 0.2 E.U./dL

## 2017-01-04 LAB — POCT UA - MICROSCOPIC ONLY

## 2017-01-04 NOTE — Patient Instructions (Signed)
It was a pleasure seeing you today in our clinic. Today we discussed your abnormal test. Here is the treatment plan we have discussed and agreed upon together: - I will call you with the results of your kidney function testing  Our clinic's number is [phone]. Please call with questions or concerns about what we discussed today.  Be well, Dr. Burr Medico

## 2017-01-04 NOTE — Progress Notes (Signed)
   CC: Abnormal lab results  HPI: Kristine Shaffer is a 76 y.o. female with PMH significant for HTN, alcohol use disorder, HLD, weight loss, dementia who presents to [facility] today after having an abnormal lab result.  Kristine Shaffer is with Kristine Shaffer who helps provide history.  She reports that the Kristine Shaffer was seen by Kristine Shaffer yesterday and noted to have abnormal kidney function. The Kristine Shaffer's Shaffer reports she was told to bring Kristine in right away. She stated that records would be faxed over from Kristine Shaffer, unfortunately these have not come through yet.  Kristine Shaffer does not have a history of kidney dysfunction and Kristine last BMP was in 07/2016 with Cr 1.07. She is urinating normally. Last urinated this AM. No N/V/D/C. No dysuria or hematuria, no oliguria. No confusion to think of uremia.  Review of Symptoms:  See HPI for ROS.   CC, SH/smoking status, and VS noted.  Objective: BP 128/70   Pulse (!) 112   Temp 98.3 F (36.8 C) (Oral)   Ht 4\' 10"  (1.473 m)   Wt 89 lb 6.4 oz (40.6 kg)   SpO2 97%   BMI 18.68 kg/m  GEN: NAD, alert, cooperative, and pleasant. RESPIRATORY: clear to auscultation bilaterally with no wheezes, rhonchi or rales, good effort CV: RRR, no m/r/g, no peripheral edema Kristine Shaffer: soft, non-tender, non-distended  CMP Latest Ref Rng & Units 01/04/2017 08/14/2016 08/13/2016  Glucose 65 - 99 mg/dL 108(H) 78 62(L)  BUN 8 - 27 mg/dL 59(H) 13 19  Creatinine 0.57 - 1.00 mg/dL 1.69(H) 1.07(H) 1.08(H)  Sodium 134 - 144 mmol/L 135 138 139  Potassium 3.5 - 5.2 mmol/L 4.8 3.6 3.8  Chloride 96 - 106 mmol/L 94(L) 104 103  CO2 20 - 29 mmol/L 25 24 15(L)  Calcium 8.7 - 10.3 mg/dL 11.2(H) 8.9 9.3  Total Protein 6.0 - 8.5 g/dL 8.5 - 7.7  Total Bilirubin 0.0 - 1.2 mg/dL 0.8 - 1.0  Alkaline Phos 39 - 117 IU/L 110 - 73  AST 0 - 40 IU/L 35 - 40  ALT 0 - 32 IU/L 24 - 19     Assessment and plan:  Abnormal renal function test Uncertain abnormal results because they were done in Kristine Shaffer office and we do not have access to the  records. Fax has not come through yet. Uncertain etiology: no diarrhea or vomiting to think of causes of prerenal AKI, no medications to think of iatrogenic cause. Rechecked CMP and UA in office. UA unremarkable, but on CMP note worsening renal function and hypercalcemia to 11.2.  - late entry: LVM for Kristine Shaffer's Shaffer but unable to get in contact with Kristine. [place] [facility] [employer] on board to help contact them. - Kristine Shaffer should drink plenty of water - Kristine Shaffer should stop multivitamin (likely has Calcium) - Will have Kristine Shaffer come back for lab visit to recheck calcium, PTH -  Return precautions include abdominal pain, AMS, cardiac symptoms, if she stops urinating - urgent nephrology referral placed    Orders Placed This Encounter  Procedures  . Comprehensive metabolic panel    Order Specific Question:   Has the Kristine Shaffer fasted?    Answer:   No  . POCT urinalysis dipstick  . POCT urinalysis dipstick  . POCT UA - Microscopic Only    Everrett Coombe, MD,MS,  PGY2 01/05/2017 9:38 AM

## 2017-01-04 NOTE — Assessment & Plan Note (Addendum)
Uncertain abnormal results because they were done in GI office and we do not have access to the records. Fax has not come through yet. Uncertain etiology: no diarrhea or vomiting to think of causes of prerenal AKI, no medications to think of iatrogenic cause. Rechecked CMP and UA in office. UA unremarkable, but on CMP note worsening renal function and hypercalcemia to 11.2.  - late entry: LVM for patient's niece but unable to get in contact with her. [place] [facility] [employer] on board to help contact them. - Patient should drink plenty of water - Patient should stop multivitamin (likely has Calcium) - Will have patient come back for lab visit to recheck calcium, PTH -  Return precautions include abdominal pain, AMS, cardiac symptoms, if she stops urinating - urgent nephrology referral placed

## 2017-01-05 ENCOUNTER — Other Ambulatory Visit: Payer: Self-pay | Admitting: Student in an Organized Health Care Education/Training Program

## 2017-01-05 ENCOUNTER — Other Ambulatory Visit: Payer: Medicare Other

## 2017-01-05 LAB — COMPREHENSIVE METABOLIC PANEL
ALT: 24 IU/L (ref 0–32)
AST: 35 IU/L (ref 0–40)
Albumin/Globulin Ratio: 1.2 (ref 1.2–2.2)
Albumin: 4.7 g/dL (ref 3.5–4.8)
Alkaline Phosphatase: 110 IU/L (ref 39–117)
BILIRUBIN TOTAL: 0.8 mg/dL (ref 0.0–1.2)
BUN/Creatinine Ratio: 35 — ABNORMAL HIGH (ref 12–28)
BUN: 59 mg/dL — AB (ref 8–27)
CALCIUM: 11.2 mg/dL — AB (ref 8.7–10.3)
CHLORIDE: 94 mmol/L — AB (ref 96–106)
CO2: 25 mmol/L (ref 20–29)
Creatinine, Ser: 1.69 mg/dL — ABNORMAL HIGH (ref 0.57–1.00)
GFR calc non Af Amer: 29 mL/min/{1.73_m2} — ABNORMAL LOW (ref >59)
GFR, EST AFRICAN AMERICAN: 34 mL/min/{1.73_m2} — AB (ref >59)
GLUCOSE: 108 mg/dL — AB (ref 65–99)
Globulin, Total: 3.8 g/dL (ref 1.5–4.5)
Potassium: 4.8 mmol/L (ref 3.5–5.2)
Sodium: 135 mmol/L (ref 134–144)
TOTAL PROTEIN: 8.5 g/dL (ref 6.0–8.5)

## 2017-01-05 NOTE — Progress Notes (Signed)
Pt niece given the information from Dr. Burr Medico and she will bring pt in today to have the requested labs. Katharina Caper, April D, [place]

## 2017-01-05 NOTE — Progress Notes (Signed)
Called and LVM for patient's niece.  Kidney function has worsened but patient is not in acute kidney failure. - Needs lab visit today, will place order for another CMP for Calcium and PTH - Will place urgent referral for Renal - Patient should drink plenty of water - Paitent should stop her multivitamin (Calcium was elevated, likely MV has calcium in it) - Return precautions include: Abdominal pain, altered mental status, chest pain or palpitations, or if she has urinary symptoms (or stops urinating)  Will reach out to [facility] CMA's to see if we can get a hold of Ms. Roger's niece and caretaker this AM since I was not able to relay all of this information over voicemail.  Everrett Coombe, MD PGY-2 [facility]

## 2017-01-06 ENCOUNTER — Telehealth: Payer: Self-pay | Admitting: Student in an Organized Health Care Education/Training Program

## 2017-01-06 LAB — COMPREHENSIVE METABOLIC PANEL
ALBUMIN: 4.5 g/dL (ref 3.5–4.8)
ALK PHOS: 100 IU/L (ref 39–117)
ALT: 23 IU/L (ref 0–32)
AST: 34 IU/L (ref 0–40)
Albumin/Globulin Ratio: 1.3 (ref 1.2–2.2)
BUN / CREAT RATIO: 31 — AB (ref 12–28)
BUN: 51 mg/dL — AB (ref 8–27)
Bilirubin Total: 0.7 mg/dL (ref 0.0–1.2)
CO2: 21 mmol/L (ref 20–29)
CREATININE: 1.63 mg/dL — AB (ref 0.57–1.00)
Calcium: 10 mg/dL (ref 8.7–10.3)
Chloride: 93 mmol/L — ABNORMAL LOW (ref 96–106)
GFR calc non Af Amer: 30 mL/min/{1.73_m2} — ABNORMAL LOW (ref >59)
GFR, EST AFRICAN AMERICAN: 35 mL/min/{1.73_m2} — AB (ref >59)
GLOBULIN, TOTAL: 3.6 g/dL (ref 1.5–4.5)
Glucose: 140 mg/dL — ABNORMAL HIGH (ref 65–99)
Potassium: 4.3 mmol/L (ref 3.5–5.2)
SODIUM: 134 mmol/L (ref 134–144)
TOTAL PROTEIN: 8.1 g/dL (ref 6.0–8.5)

## 2017-01-06 LAB — PTH, INTACT AND CALCIUM: PTH: 62 pg/mL (ref 15–65)

## 2017-01-06 NOTE — Telephone Encounter (Signed)
LVM - Calcium improved, PTH normal from lab visit yesterday. Continue current plan, Renal consult placed.

## 2017-01-10 ENCOUNTER — Encounter: Payer: Self-pay | Admitting: Student in an Organized Health Care Education/Training Program

## 2017-01-11 NOTE — Telephone Encounter (Signed)
Called and spoke with patient's niece and caretaker. She had sent me a message asking about next steps for her Aunt's kidneys. I explained that a referral for nephrology was placed and she should get a phone call to schedule an appointment soon. Went over recommendations and red flags to watch out for as previously documented.

## 2017-01-16 ENCOUNTER — Ambulatory Visit: Payer: Medicare Other | Admitting: Internal Medicine

## 2017-02-23 ENCOUNTER — Other Ambulatory Visit: Payer: Self-pay | Admitting: Nephrology

## 2017-02-23 DIAGNOSIS — N183 Chronic kidney disease, stage 3 unspecified: Secondary | ICD-10-CM

## 2017-02-23 DIAGNOSIS — D631 Anemia in chronic kidney disease: Secondary | ICD-10-CM | POA: Diagnosis not present

## 2017-02-23 DIAGNOSIS — I129 Hypertensive chronic kidney disease with stage 1 through stage 4 chronic kidney disease, or unspecified chronic kidney disease: Secondary | ICD-10-CM | POA: Diagnosis not present

## 2017-02-23 DIAGNOSIS — R634 Abnormal weight loss: Secondary | ICD-10-CM | POA: Diagnosis not present

## 2017-02-23 DIAGNOSIS — F0390 Unspecified dementia without behavioral disturbance: Secondary | ICD-10-CM | POA: Diagnosis not present

## 2017-02-23 DIAGNOSIS — E785 Hyperlipidemia, unspecified: Secondary | ICD-10-CM | POA: Diagnosis not present

## 2017-02-23 DIAGNOSIS — N179 Acute kidney failure, unspecified: Secondary | ICD-10-CM | POA: Diagnosis not present

## 2017-03-01 ENCOUNTER — Ambulatory Visit
Admission: RE | Admit: 2017-03-01 | Discharge: 2017-03-01 | Disposition: A | Discharge status: Home / Self Care | Payer: Medicare Other | Source: Ambulatory Visit | Attending: Nephrology | Admitting: Nephrology

## 2017-03-01 DIAGNOSIS — N183 Chronic kidney disease, stage 3 unspecified: Secondary | ICD-10-CM

## 2017-03-01 DIAGNOSIS — N189 Chronic kidney disease, unspecified: Secondary | ICD-10-CM | POA: Diagnosis not present

## 2017-05-11 DIAGNOSIS — N183 Chronic kidney disease, stage 3 (moderate): Secondary | ICD-10-CM | POA: Diagnosis not present

## 2017-05-11 DIAGNOSIS — F0390 Unspecified dementia without behavioral disturbance: Secondary | ICD-10-CM | POA: Diagnosis not present

## 2017-05-11 DIAGNOSIS — D631 Anemia in chronic kidney disease: Secondary | ICD-10-CM | POA: Diagnosis not present

## 2017-05-11 DIAGNOSIS — N179 Acute kidney failure, unspecified: Secondary | ICD-10-CM | POA: Diagnosis not present

## 2017-05-11 DIAGNOSIS — E785 Hyperlipidemia, unspecified: Secondary | ICD-10-CM | POA: Diagnosis not present

## 2017-05-11 DIAGNOSIS — R634 Abnormal weight loss: Secondary | ICD-10-CM | POA: Diagnosis not present

## 2017-05-11 DIAGNOSIS — Z789 Other specified health status: Secondary | ICD-10-CM | POA: Diagnosis not present

## 2017-05-11 DIAGNOSIS — I129 Hypertensive chronic kidney disease with stage 1 through stage 4 chronic kidney disease, or unspecified chronic kidney disease: Secondary | ICD-10-CM | POA: Diagnosis not present

## 2017-05-25 ENCOUNTER — Other Ambulatory Visit (HOSPITAL_COMMUNITY): Payer: Self-pay

## 2017-05-26 ENCOUNTER — Encounter (HOSPITAL_COMMUNITY)
Admission: RE | Admit: 2017-05-26 | Discharge: 2017-05-26 | Disposition: A | Discharge status: Home / Self Care | Payer: Medicare Other | Source: Ambulatory Visit | Attending: Nephrology | Admitting: Nephrology

## 2017-05-26 DIAGNOSIS — D631 Anemia in chronic kidney disease: Secondary | ICD-10-CM | POA: Insufficient documentation

## 2017-05-26 DIAGNOSIS — N189 Chronic kidney disease, unspecified: Secondary | ICD-10-CM | POA: Insufficient documentation

## 2017-05-26 MED ORDER — SODIUM CHLORIDE 0.9 % IV SOLN
510.0000 mg | INTRAVENOUS | Status: DC
  Administered 2017-05-26: 510 mg via INTRAVENOUS
  Filled 2017-05-26: qty 17

## 2017-05-27 ENCOUNTER — Other Ambulatory Visit: Payer: Self-pay | Admitting: Family Medicine

## 2017-06-02 ENCOUNTER — Encounter (HOSPITAL_COMMUNITY)
Admission: RE | Admit: 2017-06-02 | Discharge: 2017-06-02 | Disposition: A | Discharge status: Home / Self Care | Payer: Medicare Other | Source: Ambulatory Visit | Attending: Nephrology | Admitting: Nephrology

## 2017-06-02 DIAGNOSIS — D631 Anemia in chronic kidney disease: Secondary | ICD-10-CM | POA: Diagnosis not present

## 2017-06-02 DIAGNOSIS — N189 Chronic kidney disease, unspecified: Secondary | ICD-10-CM | POA: Diagnosis not present

## 2017-06-02 MED ORDER — SODIUM CHLORIDE 0.9 % IV SOLN
510.0000 mg | INTRAVENOUS | Status: DC
  Administered 2017-06-02: 510 mg via INTRAVENOUS
  Filled 2017-06-02: qty 17

## 2017-06-14 DIAGNOSIS — N183 Chronic kidney disease, stage 3 (moderate): Secondary | ICD-10-CM | POA: Diagnosis not present

## 2017-06-14 DIAGNOSIS — I129 Hypertensive chronic kidney disease with stage 1 through stage 4 chronic kidney disease, or unspecified chronic kidney disease: Secondary | ICD-10-CM | POA: Diagnosis not present

## 2017-07-07 DIAGNOSIS — I129 Hypertensive chronic kidney disease with stage 1 through stage 4 chronic kidney disease, or unspecified chronic kidney disease: Secondary | ICD-10-CM | POA: Diagnosis not present

## 2017-08-03 ENCOUNTER — Other Ambulatory Visit: Payer: Self-pay | Admitting: Internal Medicine

## 2017-08-29 ENCOUNTER — Other Ambulatory Visit: Payer: Self-pay | Admitting: Internal Medicine

## 2017-09-14 ENCOUNTER — Other Ambulatory Visit: Payer: Self-pay | Admitting: Internal Medicine

## 2017-12-12 ENCOUNTER — Other Ambulatory Visit: Payer: Self-pay | Admitting: Internal Medicine

## 2018-01-04 ENCOUNTER — Other Ambulatory Visit: Payer: Self-pay | Admitting: Family Medicine

## 2018-01-07 ENCOUNTER — Other Ambulatory Visit: Payer: Self-pay | Admitting: Internal Medicine

## 2018-03-14 ENCOUNTER — Other Ambulatory Visit: Payer: Self-pay | Admitting: Family Medicine

## 2018-06-11 ENCOUNTER — Telehealth: Payer: Self-pay

## 2018-06-11 NOTE — Telephone Encounter (Signed)
Pam, with [facility], called nurse line stating the patient has refused blood work and apts.

## 2018-06-12 NOTE — Telephone Encounter (Signed)
Thanks for the [facility] Page

## 2018-07-11 ENCOUNTER — Other Ambulatory Visit: Payer: Self-pay | Admitting: Family Medicine

## 2018-11-16 ENCOUNTER — Other Ambulatory Visit: Payer: Self-pay

## 2018-12-06 IMAGING — CR DG CHEST 2V
2 series · 2 of 2 positions shown · non-contrast
Comparison: February 04, 2016

CLINICAL DATA: Anorexia with weight loss.  Hypertension.

EXAM:
CHEST  2 VIEW

[w chest pa]
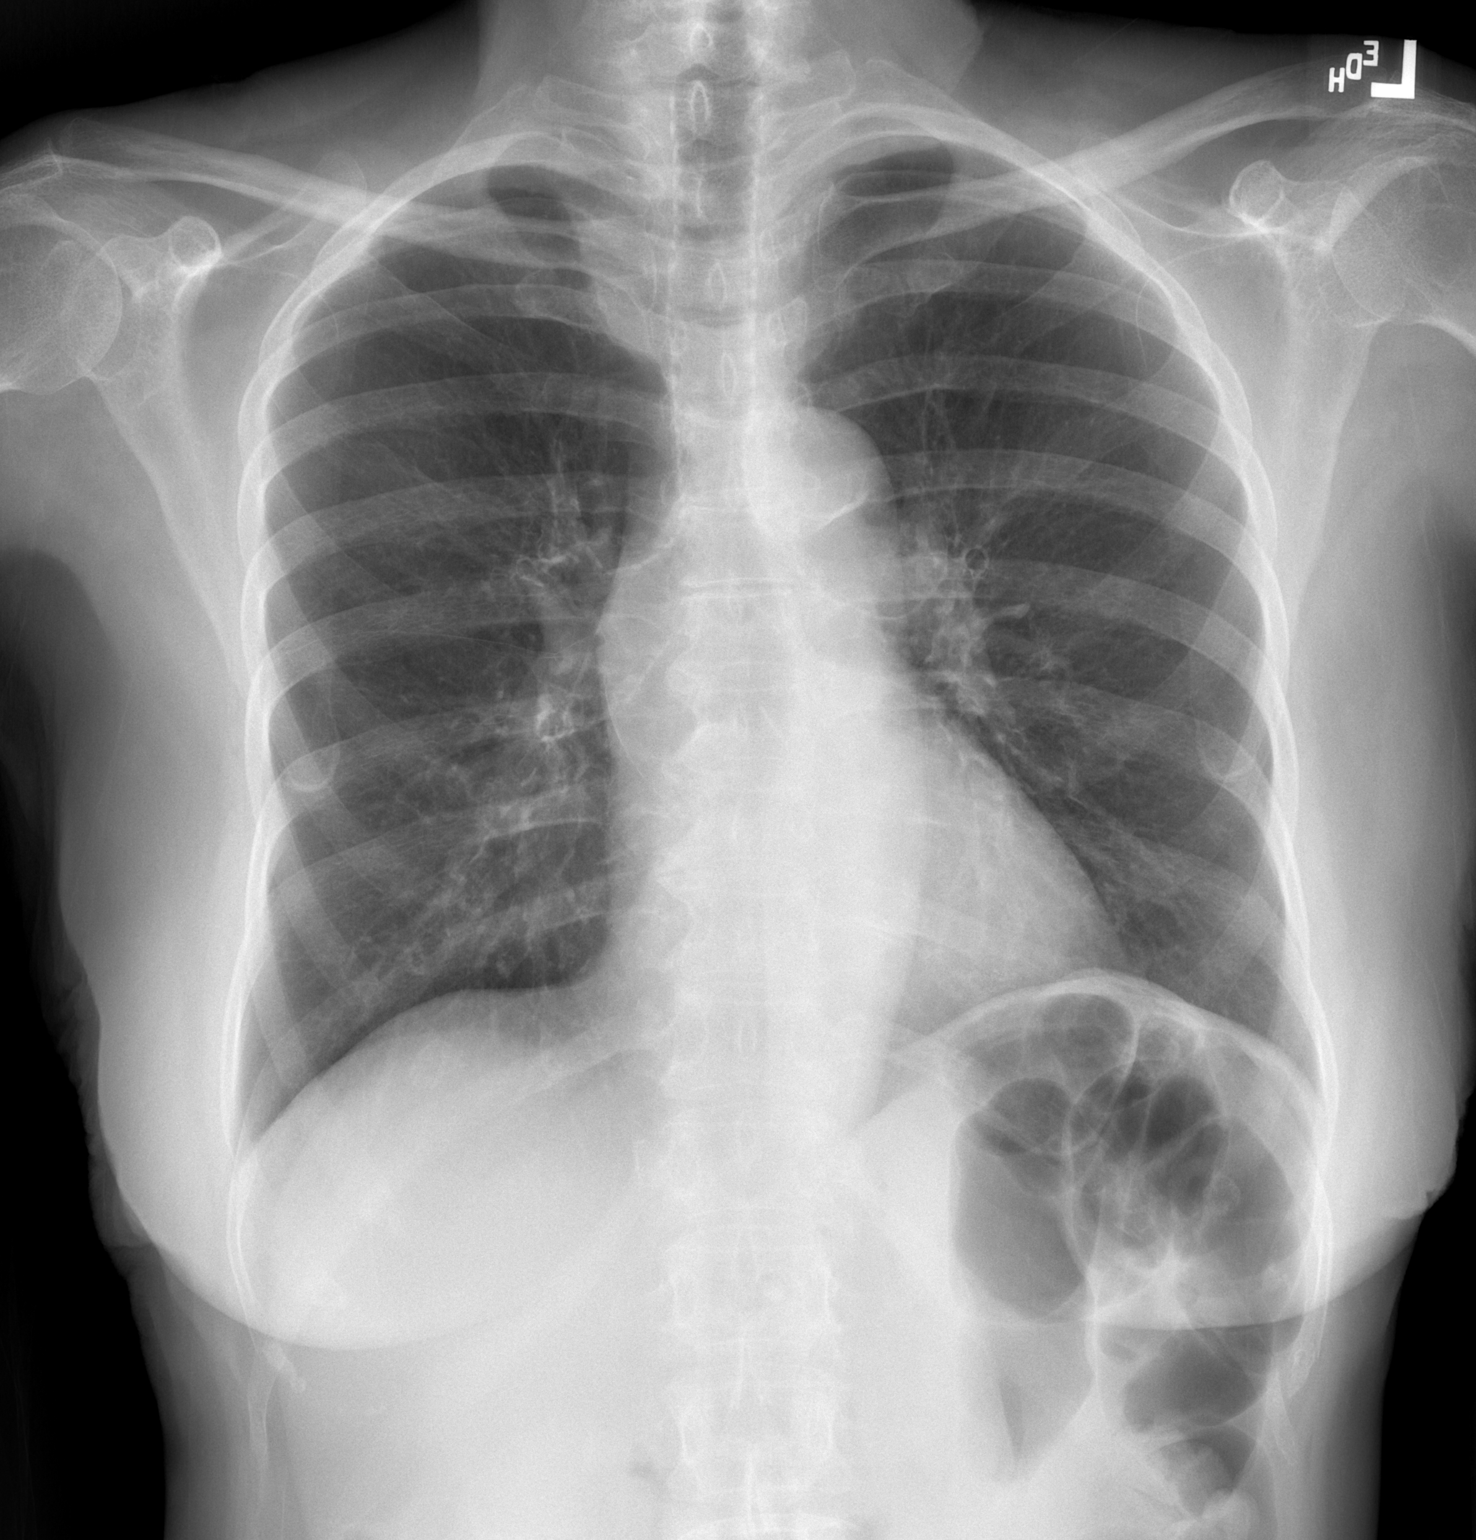

[w chest lat]
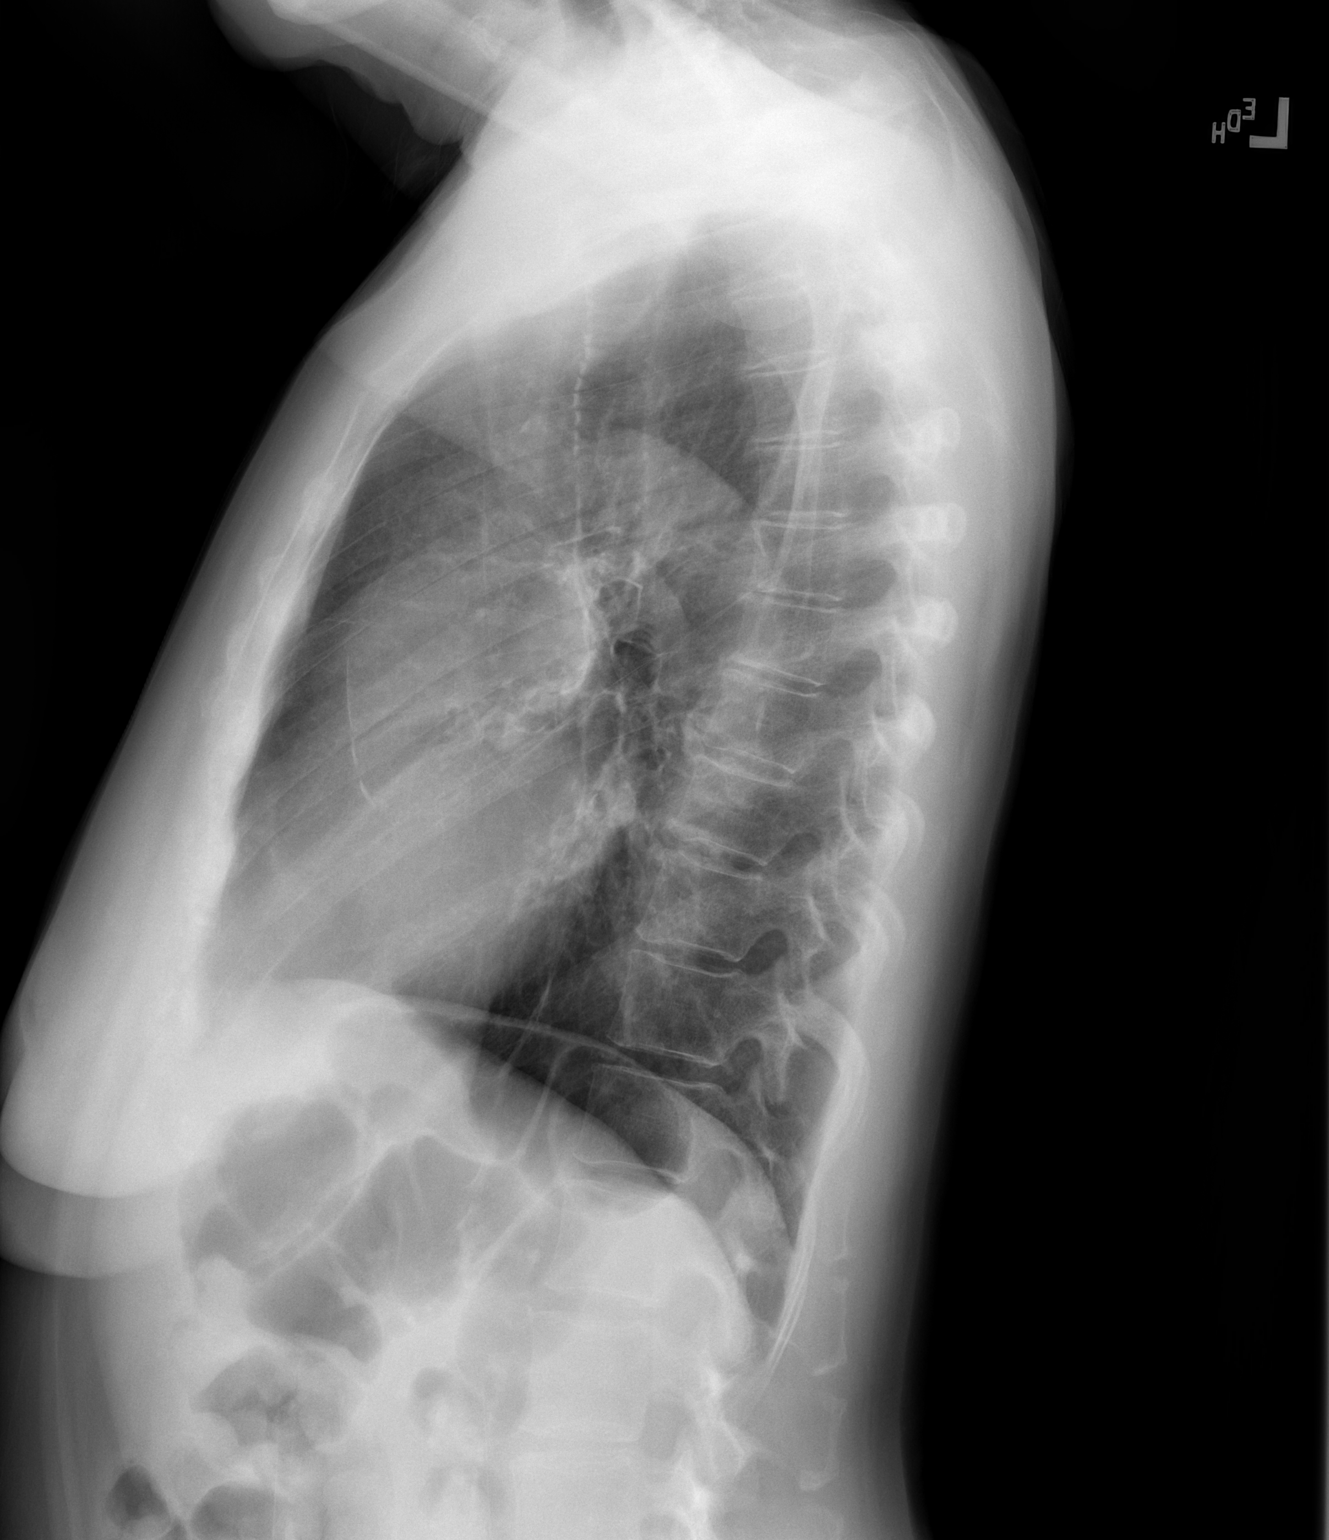

[2 of 2 positions shown; findings below may reference images not displayed]

FINDINGS: Lungs are clear. Heart size and pulmonary vascularity are normal. No
adenopathy. There is aortic atherosclerosis. There is stable
prominence in the right paratracheal region, likely great vessel
prominence. No evident bone lesion.
IMPRESSION: Aortic atherosclerosis.  No edema or consolidation.

Aortic Atherosclerosis ([id number]).

## 2019-01-16 ENCOUNTER — Other Ambulatory Visit: Payer: Self-pay

## 2019-01-16 MED ORDER — FERROUS SULFATE 325 (65 FE) MG PO TABS: ORAL_TABLET | ORAL | 3 refills | Status: AC

## 2019-01-16 NOTE — Telephone Encounter (Signed)
LMOVm for pt to cal back and make an appt with pcp.Deseree Kennon Holter, CMA

## 2019-01-16 NOTE — Telephone Encounter (Signed)
Please schedule patient for a hypertension/CKD follow up. She has not been seen since 2018 within our clinic.   Patriciaann Clan, DO

## 2019-04-09 ENCOUNTER — Other Ambulatory Visit: Payer: Self-pay | Admitting: *Deleted

## 2019-04-10 MED ORDER — LABETALOL HCL 200 MG PO TABS: 200.0000 mg | ORAL_TABLET | Freq: Two times a day (BID) | ORAL | 0 refills | Status: DC

## 2019-04-10 MED ORDER — ATORVASTATIN CALCIUM 80 MG PO TABS: 80.0000 mg | ORAL_TABLET | Freq: Every day | ORAL | 3 refills | Status: AC

## 2019-05-09 ENCOUNTER — Other Ambulatory Visit: Payer: Self-pay | Admitting: Family Medicine

## 2019-07-11 ENCOUNTER — Other Ambulatory Visit: Payer: Self-pay | Admitting: Family Medicine

## 2019-07-11 NOTE — Telephone Encounter (Signed)
She has not been seen in our clinic since 2018, unfortunately have left several voicemails in the past year trying to reach her. She needs to follow up within the clinic for a hypertension follow up for continued prescriptions.   Patriciaann Clan, DO

## 2019-08-02 ENCOUNTER — Other Ambulatory Visit: Payer: Self-pay | Admitting: Family Medicine

## 2019-08-08 ENCOUNTER — Ambulatory Visit: Payer: Medicare Other | Attending: Family

## 2019-08-08 DIAGNOSIS — Z23 Encounter for immunization: Secondary | ICD-10-CM

## 2019-08-08 NOTE — Progress Notes (Signed)
   Covid-19 Vaccination Clinic  Name:  Kristine Shaffer    MRN: [id number] DOB: 1940-12-08  08/08/2019  [name] was observed post Covid-19 immunization for 15 minutes without incident. She was provided with Vaccine Information Sheet and instruction to access the V-Safe system.   [name] was instructed to call 911 with any severe reactions post vaccine: Marland Kitchen Difficulty breathing  . Swelling of face and throat  . A fast heartbeat  . A bad rash all over body  . Dizziness and weakness   Immunizations Administered    Name Date Dose VIS Date Route   Moderna COVID-19 Vaccine 08/08/2019  1:41 PM 0.5 mL 03/2019 Intramuscular   Manufacturer: Moderna   Lot: [id number]   [id number]

## 2019-08-31 ENCOUNTER — Other Ambulatory Visit: Payer: Self-pay | Admitting: Family Medicine

## 2019-09-03 ENCOUNTER — Ambulatory Visit: Payer: Medicare Other | Attending: Family

## 2019-09-03 DIAGNOSIS — Z23 Encounter for immunization: Secondary | ICD-10-CM

## 2019-09-03 NOTE — Progress Notes (Signed)
   Covid-19 Vaccination Clinic  Name:  Kristine Shaffer    MRN: [id number] DOB: March 08, 1941  09/03/2019  [name] was observed post Covid-19 immunization for 15 minutes without incident. She was provided with Vaccine Information Sheet and instruction to access the V-Safe system.   [name] was instructed to call 911 with any severe reactions post vaccine: Marland Kitchen Difficulty breathing  . Swelling of face and throat  . A fast heartbeat  . A bad rash all over body  . Dizziness and weakness   Immunizations Administered    Name Date Dose VIS Date Route   Moderna COVID-19 Vaccine 09/03/2019  2:11 PM 0.5 mL 03/2019 Intramuscular   Manufacturer: Moderna   Lot: [id number]   [id number]

## 2021-09-21 ENCOUNTER — Encounter: Payer: Self-pay | Admitting: *Deleted
